# Patient Record
Sex: Male | Born: 1955 | Race: Black or African American | Hispanic: No | Marital: Single | Smoking: Never smoker
Health system: Southern US, Community
[De-identification: ages and names within clinical notes are randomized; demographics above are authoritative.]

## PROBLEM LIST (undated history)

## (undated) DIAGNOSIS — R569 Unspecified convulsions: Secondary | ICD-10-CM

## (undated) HISTORY — PX: TONSILLECTOMY: SUR1361

---

## 1998-04-20 ENCOUNTER — Emergency Department (HOSPITAL_COMMUNITY): Admission: EM | Admit: 1998-04-20 | Discharge: 1998-04-20 | Payer: Self-pay | Admitting: Emergency Medicine

## 1998-05-06 ENCOUNTER — Emergency Department (HOSPITAL_COMMUNITY): Admission: EM | Admit: 1998-05-06 | Discharge: 1998-05-06 | Payer: Self-pay | Admitting: Emergency Medicine

## 1998-07-10 ENCOUNTER — Emergency Department (HOSPITAL_COMMUNITY): Admission: EM | Admit: 1998-07-10 | Discharge: 1998-07-10 | Payer: Self-pay | Admitting: Emergency Medicine

## 1998-10-10 ENCOUNTER — Emergency Department (HOSPITAL_COMMUNITY): Admission: EM | Admit: 1998-10-10 | Discharge: 1998-10-10 | Payer: Self-pay | Admitting: Emergency Medicine

## 2000-02-16 ENCOUNTER — Emergency Department (HOSPITAL_COMMUNITY): Admission: EM | Admit: 2000-02-16 | Discharge: 2000-02-16 | Payer: Self-pay | Admitting: Emergency Medicine

## 2000-02-17 ENCOUNTER — Encounter: Payer: Self-pay | Admitting: Emergency Medicine

## 2000-02-23 ENCOUNTER — Emergency Department (HOSPITAL_COMMUNITY): Admission: EM | Admit: 2000-02-23 | Discharge: 2000-02-23 | Payer: Self-pay | Admitting: Emergency Medicine

## 2000-08-04 ENCOUNTER — Emergency Department (HOSPITAL_COMMUNITY): Admission: EM | Admit: 2000-08-04 | Discharge: 2000-08-04 | Payer: Self-pay | Admitting: Emergency Medicine

## 2001-05-09 ENCOUNTER — Encounter: Payer: Self-pay | Admitting: Emergency Medicine

## 2001-05-09 ENCOUNTER — Emergency Department (HOSPITAL_COMMUNITY): Admission: EM | Admit: 2001-05-09 | Discharge: 2001-05-09 | Payer: Self-pay | Admitting: Emergency Medicine

## 2001-05-14 ENCOUNTER — Emergency Department (HOSPITAL_COMMUNITY): Admission: EM | Admit: 2001-05-14 | Discharge: 2001-05-14 | Payer: Self-pay | Admitting: *Deleted

## 2002-06-25 ENCOUNTER — Emergency Department (HOSPITAL_COMMUNITY): Admission: EM | Admit: 2002-06-25 | Discharge: 2002-06-25 | Payer: Self-pay | Admitting: Emergency Medicine

## 2004-10-15 ENCOUNTER — Ambulatory Visit: Payer: Self-pay | Admitting: Internal Medicine

## 2006-07-28 ENCOUNTER — Emergency Department (HOSPITAL_COMMUNITY): Admission: EM | Admit: 2006-07-28 | Discharge: 2006-07-28 | Payer: Self-pay | Admitting: Emergency Medicine

## 2006-07-31 ENCOUNTER — Emergency Department (HOSPITAL_COMMUNITY): Admission: EM | Admit: 2006-07-31 | Discharge: 2006-07-31 | Payer: Self-pay | Admitting: Emergency Medicine

## 2007-07-30 ENCOUNTER — Emergency Department (HOSPITAL_COMMUNITY): Admission: EM | Admit: 2007-07-30 | Discharge: 2007-07-30 | Payer: Self-pay | Admitting: Emergency Medicine

## 2007-10-16 ENCOUNTER — Inpatient Hospital Stay (HOSPITAL_COMMUNITY): Admission: EM | Admit: 2007-10-16 | Discharge: 2007-10-22 | Payer: Self-pay | Admitting: Emergency Medicine

## 2007-11-10 ENCOUNTER — Telehealth: Payer: Self-pay | Admitting: *Deleted

## 2008-08-01 ENCOUNTER — Emergency Department (HOSPITAL_COMMUNITY): Admission: EM | Admit: 2008-08-01 | Discharge: 2008-08-01 | Payer: Self-pay | Admitting: Emergency Medicine

## 2009-03-17 ENCOUNTER — Emergency Department (HOSPITAL_COMMUNITY): Admission: EM | Admit: 2009-03-17 | Discharge: 2009-03-18 | Payer: Self-pay | Admitting: Emergency Medicine

## 2009-03-24 ENCOUNTER — Emergency Department (HOSPITAL_COMMUNITY): Admission: EM | Admit: 2009-03-24 | Discharge: 2009-03-24 | Payer: Self-pay | Admitting: Family Medicine

## 2010-09-28 ENCOUNTER — Emergency Department (HOSPITAL_COMMUNITY)
Admission: EM | Admit: 2010-09-28 | Discharge: 2010-09-28 | Payer: Self-pay | Source: Home / Self Care | Admitting: Emergency Medicine

## 2011-02-19 LAB — POCT I-STAT, CHEM 8
Chloride: 107 mEq/L (ref 96–112)
Creatinine, Ser: 1 mg/dL (ref 0.4–1.5)
Glucose, Bld: 93 mg/dL (ref 70–99)
Hemoglobin: 15.6 g/dL (ref 13.0–17.0)
Potassium: 4 mEq/L (ref 3.5–5.1)

## 2011-03-26 NOTE — Discharge Summary (Signed)
NAMESEANPATRICK, Raymond [name]                ACCOUNT NO.:  [id number]   MEDICAL RECORD NO.:  [id number]          PATIENT TYPE:  INP   LOCATION:  1435                         FACILITY:  [facility]   PHYSICIAN:  Hillery Aldo, M.D.   DATE OF BIRTH:  [birth year]   DATE OF ADMISSION:  10/16/2007  DATE OF DISCHARGE:  10/22/2007                               DISCHARGE SUMMARY   PRIMARY CARE PHYSICIAN:  None, but has seen Dr. Yisroel Ramming in the  past.  He will be referred back to Dr. Philipp Deputy for further follow up  hospital care.   DISCHARGE DIAGNOSES:  1. Alcohol withdrawal seizures.  2. Alcohol dependency.  3. Hyponatremia.  4. Alcoholic hepatitis.  5. Delirium secondary to alcohol withdrawal.  6. Thrombocytopenia secondary to alcohol abuse.  7. Hypertension  8. Hypokalemia.  9. Hypomagnesemia.  10.Foot pain, questionable gout.  11.Paranasal sinus disease.   DISCHARGE MEDICATIONS:  1. Indocin 50 mg q.8 h. p.r.n.  2. Clonidine 0.1 mg q.12 h. p.r.n.  3. Multivitamin daily.   CONSULTATIONS:  Dr. Jeanie Sewer of psychiatry.   HISTORY OF PRESENT ILLNESS:  The patient is a 55 year old male with a  past medical history of heavy alcohol abuse.  The patient blacked out  and was brought to the hospital by EMS for further evaluation.  An eye  witness reported to EMS that the patient was having a tonic-clonic  seizure and he was therefore brought to the hospital for further  evaluation and workup.   PROCEDURES/DIAGNOSTICS:  1. CT scan of the head on October 16, 2007 showed no acute      intracranial findings.  Moderate generalized atrophy.  Paranasal      sinus disease with opacification of right maxillary sinus.  2. EEG on October 19, 2007 showed no definite epileptiform features.   LABORATORY DATA:  Sodium 131, potassium 4.0, chloride 99, bicarb 25, BUN  5, creatinine 0.70, glucose 101, total bilirubin 1.1, alkaline  phosphatase 58, AST 69, ALT 33, total protein 8.3, albumin 3.2, calcium  9.7,  white blood cell count 8.8, hemoglobin 14, hematocrit 39.6,  platelets 134.   HOSPITAL COURSE:  1. Alcohol withdrawal seizure/alcohol dependency:  The patient was      admitted and put on an Ativan protocol.  His Ativan was tapered to      p.r.n. dosing only at the time of discharge.  He was put on seizure      precautions with no further seizure events noted.  He was      supplemented with thiamine and folic acid.  A psychiatry      consultation was requested and kindly provided by Dr. Jeanie Sewer.      The patient declined inpatient rehabilitation and was not felt to      be committable.  He was provided with outpatient resources for      ongoing treatment of his alcohol dependency.  At this time, he is      medically stable for discharge and plans to follow up in an alcohol      treatment outpatient program.  2. Electrolyte abnormalities:  The patient's hypomagnesemia and      hypokalemia was corrected.  He still has some mild hyponatremia      which should resolve with avoidance of alcohol.  3. Alcoholic hepatitis:  Patient's transaminase elevation has steadily      improved through the course of his hospitalization.  It is expected      that he will have normalization of his liver function studies with      avoidance of alcohol.  4. Thrombocytopenia:  The patient's thrombocytopenia is felt to be due      to toxic effects of alcohol and bone marrow.  It was mild and the      patient did not experience any signs of active bleeding.  5. Hypertension:  The patient was started on clonidine for control of      his blood pressure.  He will be discharged on this medication and      instructions to follow up with Dr. Philipp Deputy for ongoing surveillance      of his blood pressure issues problem.  6. Left foot pain:  The patient did develop some left foot pain in the      instep area.  There is no obvious swelling or erythema to the area.      It was felt that this may be due to gout versus  tendonitis.  The      patient was put on Indocin to use as needed for pain control.   DISPOSITION:  The patient is medically stable for discharge.  He  declines outpatient physical therapy despite his fairly unsteady gait.  He will be provided with a rolling walker and will be discharged home.  He has a girlfriend who will help provide his care and who he will live  with upon discharge.  The patient is provided with Dr. Danella Deis number  and instructed to call her for a follow up appointment.      Hillery Aldo, M.D.  Electronically Signed     CR/MEDQ  D:  10/22/2007  T:  10/22/2007  Job:  [id number]   cc:   Tresa Endo L. Philipp Deputy, M.D.  Fax: [phone]

## 2011-03-26 NOTE — Procedures (Signed)
EEG NUMBER:  [id number].   REFERRING PHYSICIAN:  [facility].   EEG is done on October 19, 2007.   CLINICAL HISTORY:  This is a 55 year old male being evaluated for  alcohol withdrawal seizures.   MEDICATIONS LISTED:  1. Protonix.  2. Multivitamin.  3. Folic acid.  4. Lovenox.  5. Ativan.  6. Phenergan.  7. Reglan.  8. Imodium.   This is a portable EEG recorded with the patient described as being  awake and confused using 17-channel machine and standard 10/20 electrode  placement.   Background awake rhythm consists of 8 Hz alpha which is admixed with  high frequency low amplitude beta activity seen throughout the tracing.  No definite epileptiform features were noted.  No paroxysmal  epileptiform activity, spikes or sharp waves are seen.  Changes of  drowsiness are achieved and show normal physiological findings.  No  definite sleep stages are seen.  Hyperventilation is not performed.  Photic stimulation was also not done, this being a portable study.  Technical component of the study is average with excessive muscle  artifact.  Length of this tracing is 22.9 minutes.  EKG tracing reveals  regular sinus rhythm.   IMPRESSION:  This EEG performed during awake and drowsy states is within  normal limits.  No definite epileptiform features were noted.           ______________________________  Sunny Schlein. Pearlean Brownie, MD    EAV:[id number]  D:  10/19/2007 18:30:45  T:  10/20/2007 09:22:19  Job #:  [id number]   cc:   [facility]

## 2011-03-26 NOTE — H&P (Signed)
[name], [name]                ACCOUNT NO.:  [id number]   MEDICAL RECORD NO.:  [id number]          PATIENT TYPE:  INP   LOCATION:  0106                         FACILITY:  [facility]   PHYSICIAN:  Mobolaji B. Bakare, M.D.DATE OF BIRTH:  07-30-1956   DATE OF ADMISSION:  10/16/2007  DATE OF DISCHARGE:                              HISTORY & PHYSICAL   PRIMARY CARE PHYSICIAN:  Unassigned.   CHIEF COMPLAINT:  Seizures.   HISTORY OF PRESENTING COMPLAINT:  Raymond [name] is a 55 year old African-  American male with a history of alcohol abuse.  He was brought to the  emergency room by the EMS with a complaint of seizures.  There is no  eyewitness accounts available.  I do not have a record of EMS.  The  patient stated that the last thing he remembers prior to arrival to the  emergency room was walking on the sidewalk between 2:30 and 3:00 p.m.  today on his way to the grocery store.  He apparently has been feeling  shaky earlier this morning.  He normally drinks about 40 ounces of beer  every day.  He gets alcohol withdrawal tremors whenever he does not.  He  did not have anything to drink today.  He was on his way to the grocery  store.  He was lying outside [employer].  An eyewitness reported to the EMS  that patient was shaking all over the ground.  Upon arrival by EMS, his  CBG was 143.  He had a 40 ounce beer next to him.  Patient was  subsequently escorted safely to the emergency room.   On arrival here, the patient was awake but having tremors.  He was  oriented x3.  His vitals were within normal except for sinus  tachycardia.   Patient has had a head CT scan, which was negative for any intracranial  abnormality.  [facility] was called for further evaluation and treatment.   The patient states that he is inclined to quit drinking.  He will be  admitted and started on acute detoxification with Ativan withdrawal  protocol.   REVIEW OF SYSTEMS:  He denies cough, fever, chest pain, headaches.   No  change in his vision.  He feels weak on his right lower extremity.   PAST MEDICAL HISTORY:  1. Patient had similar alcohol-related seizures about 15 years ago.  2. He had a comminuted fracture of his right femoris in September,      2007.   MEDICATIONS:  None.   ALLERGIES:  No known drug allergies.   FAMILY HISTORY:  Both parents are deceased.  Mother had Alzheimer's.  Father passed away from old age.   SOCIAL HISTORY:  Patient is currently unemployed because of his right  femoral fracture.  He denies illicit drug abuse.  He denies smoking.  He  drinks 40 ounces of beer almost every day, and he has been doing this  for several years.   PHYSICAL EXAMINATION:  INITIAL VITALS:  Temperature 98, blood pressure  128/84, pulse 116, respiratory rate 18 seconds per minute.  O2 sats of  94%.  On examination, patient is awake, alert and oriented to time, place, and  person.  Normocephalic and atraumatic head.  Pupils are equal, round and reactive  to light.  Extraocular muscle movement intact.  No elevated JVD.  Mucous  membranes moist.  LUNGS:  Clear clinically to auscultation.  CVS:  S1 and S2 regular.  No murmur, no gallop.  ABDOMEN:  Nondistended, soft, nontender.  Bowel sounds present.  EXTREMITIES:  No pedal edema or calf tenderness.  Patient has tremors.  SKIN:  He has what appears like a heat rash on his posterior trunk.  No  acute erythematous rash noted.  CNS:  Multiple 5/5 in all limbs.  Cranial nerves are intact.  There is  no objective weakness in the right lower extremity noted.  Patient was  not ambulated because of unsteadiness.   INITIAL LABORATORY DATA:  White cell count 10.5, hemoglobin 14.6,  hematocrit 41.6, platelets 98, neutrophils 87%, lymphocytes 8%, absolute  neutrophil count 9.2.  Sodium 139, potassium 3.9, chloride 98, CO2 22,  glucose 177.  BUN 2, creatinine 0.92.  Total bilirubin 0.5, AST 144, ALT  47, total protein 9.1, albumin 3.8, calcium 9.6, total  bilirubin 1.5,  alkaline phosphatase 65.  Alcohol level less than 5.   Head CT scan showed no acute intracranial abnormality.   ADMISSION DIAGNOSES:  1. Alcohol-withdrawal seizures.  2. Alcohol dependence.  3. Thrombocytopenia related to alcohol.   PLAN:  Admit to telemetry.  Start on Ativan withdrawal protocol.  Thiamine 100 mg daily.  Multivitamin 1 daily.  Folic acid once daily.  IV fluids with normal saline 125 cc/hr with initial banana bag.  PT/OT  evaluation.  He will be placed on fall and seizure precautions.  Patient  will need referral to [facility] for alcohol rehabilitation.      Mobolaji B. Corky Downs, M.D.  Electronically Signed     MBB/MEDQ  D:  10/16/2007  T:  10/16/2007  Job:  [id number]

## 2011-03-26 NOTE — Consult Note (Signed)
NAMEAUGIE, VANE NO.:  [id number]   MEDICAL RECORD NO.:  [id number]          PATIENT TYPE:  INP   LOCATION:  1435                         FACILITY:  [facility]   PHYSICIAN:  Antonietta Breach, M.D.  DATE OF BIRTH:  1956/02/05   DATE OF CONSULTATION:  10/19/2007  DATE OF DISCHARGE:  10/22/2007                                 CONSULTATION   REASON FOR CONSULTATION:  Alcohol dependence.   REQUESTING PHYSICIAN:  [facility].   HISTORY OF PRESENT ILLNESS:  Mr. Raymond [name] is a 55 year old male  admitted to [facility] on October 16, 2007, with a seizure.   The patient drinks excessive amounts of alcohol daily to the point of  tolerance and to the point of having alcohol withdrawal.  He now had a  withdrawal seizure.  He has been placed on the alcohol withdrawal  protocol.   The patient's mood is within normal limits.  He does have thoughts of  harming himself or others.  He has no hallucinations, delusions.  He is  cooperative with medical care in the hospital and socially appropriate.   PAST PSYCHIATRIC HISTORY:  Mr. Raymond [name] initially had confusion and  impairment in reasoning and thought process when he was first admitted  to the hospital.  This has now cleared and he is able to make healthcare  decision.   FAMILY PSYCHIATRIC HISTORY:  None.   SOCIAL HISTORY:  Mr. Raymond [name] is single.  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   PAST MEDICAL HISTORY:  Status post seizure.   MENTAL STATUS EXAM:  Mr. Raymond [name] is alert.  He is oriented to all spheres.  His eye contact is good.  He is socially appropriate.  Affect is broad  and appropriate.  Mood within normal limits.  Thought process logical,  coherent, goal-directed.  No looseness of associations.  Memory function  intact.  Thought content no thoughts of harming himself.  No thoughts of  harming others.  No delusions, no hallucinations.  Judgment is intact.  He is socially appropriate.   ASSESSMENT:  AXIS I:   Alcohol dependence.  AXIS II:  Deferred.  AXIS III:  Status post seizure.  AXIS IV:  Primary support group.  AXIS V:  55.   Mr. Raymond [name] has now regained his ability to make __________ consistent  choice.  He can differentiate between his options and their associated  risks versus benefits.  He can appreciate the risk of his alcohol  lifestyle regarding his health, and he can reason well.   Mr. Raymond [name] does have the capacity for informed consent including choosing  whether or not to participate in alcohol rehabilitation.   The undersigned recommended that the patient enter an alcohol  rehabilitation and prevention program.  However, the patient declined  and __________ .   RECOMMENDATIONS:  If the patient changes his mind, would call [phone]  for his rehabilitation options, and would provide information on 12-step  groups.      Antonietta Breach, M.D.  Electronically Signed     JW/MEDQ  D:  11/01/2007  T:  11/02/2007  Job:  [id number]

## 2011-08-19 LAB — CBC
HCT: 39.6
HCT: 40.3
Hemoglobin: 14
Hemoglobin: 14.6
MCHC: 35.1
MCHC: 35.5
MCV: 94.3
MCV: 95.4
MCV: 95.7
Platelets: 93 — ABNORMAL LOW
RBC: 4.17 — ABNORMAL LOW
RBC: 4.23
RBC: 4.41
RDW: 12.7
RDW: 12.8
WBC: 10.2

## 2011-08-19 LAB — URINALYSIS, MICROSCOPIC ONLY
Ketones, ur: 15 — AB
Protein, ur: 100 — AB
Urobilinogen, UA: 2 — ABNORMAL HIGH

## 2011-08-19 LAB — MAGNESIUM: Magnesium: 1.9

## 2011-08-19 LAB — DIFFERENTIAL
Eosinophils Relative: 0
Monocytes Absolute: 0.5
Monocytes Relative: 5
Neutrophils Relative %: 87 — ABNORMAL HIGH

## 2011-08-19 LAB — COMPREHENSIVE METABOLIC PANEL
ALT: 49
AST: 108 — ABNORMAL HIGH
AST: 152 — ABNORMAL HIGH
Albumin: 3.3 — ABNORMAL LOW
Alkaline Phosphatase: 56
Alkaline Phosphatase: 58
BUN: 3 — ABNORMAL LOW
BUN: 5 — ABNORMAL LOW
CO2: 18 — ABNORMAL LOW
CO2: 22
Calcium: 9.2
Calcium: 9.6
Chloride: 107
Chloride: 99
Creatinine, Ser: 0.67
Creatinine, Ser: 0.69
Creatinine, Ser: 0.7
Creatinine, Ser: 0.92
GFR calc Af Amer: >60
GFR calc Af Amer: >60
GFR calc non Af Amer: >60
GFR calc non Af Amer: >60
Glucose, Bld: 101 — ABNORMAL HIGH
Glucose, Bld: 173 — ABNORMAL HIGH
Potassium: 4
Potassium: 4.5
Sodium: 133 — ABNORMAL LOW
Total Bilirubin: 1.1
Total Bilirubin: 1.9 — ABNORMAL HIGH
Total Protein: 8.3
Total Protein: 8.4 — ABNORMAL HIGH

## 2011-08-19 LAB — BASIC METABOLIC PANEL
BUN: 2 — ABNORMAL LOW
Calcium: 8.9
Creatinine, Ser: 0.67
GFR calc non Af Amer: >60
Glucose, Bld: 89
Sodium: 134 — ABNORMAL LOW

## 2011-08-19 LAB — HEMOGLOBIN A1C: Hgb A1c MFr Bld: 6

## 2011-08-19 LAB — RAPID URINE DRUG SCREEN, HOSP PERFORMED
Amphetamines: NOT DETECTED
Barbiturates: NOT DETECTED
Benzodiazepines: NOT DETECTED
Cocaine: NOT DETECTED

## 2011-08-19 LAB — PROTIME-INR: INR: 1.1

## 2013-04-14 ENCOUNTER — Inpatient Hospital Stay (HOSPITAL_COMMUNITY)
Admission: EM | Admit: 2013-04-14 | Discharge: 2013-04-18 | DRG: 897 | Disposition: A | Discharge status: Home / Self Care | Payer: MEDICAID | Attending: Internal Medicine | Admitting: Internal Medicine

## 2013-04-14 ENCOUNTER — Encounter (HOSPITAL_COMMUNITY): Payer: Self-pay

## 2013-04-14 ENCOUNTER — Emergency Department (HOSPITAL_COMMUNITY): Payer: Self-pay

## 2013-04-14 DIAGNOSIS — R569 Unspecified convulsions: Secondary | ICD-10-CM

## 2013-04-14 DIAGNOSIS — M6282 Rhabdomyolysis: Secondary | ICD-10-CM

## 2013-04-14 DIAGNOSIS — F10231 Alcohol dependence with withdrawal delirium: Principal | ICD-10-CM | POA: Diagnosis present

## 2013-04-14 DIAGNOSIS — F1020 Alcohol dependence, uncomplicated: Secondary | ICD-10-CM | POA: Diagnosis present

## 2013-04-14 DIAGNOSIS — D696 Thrombocytopenia, unspecified: Secondary | ICD-10-CM | POA: Diagnosis present

## 2013-04-14 DIAGNOSIS — R7989 Other specified abnormal findings of blood chemistry: Secondary | ICD-10-CM | POA: Diagnosis present

## 2013-04-14 DIAGNOSIS — F10931 Alcohol use, unspecified with withdrawal delirium: Principal | ICD-10-CM | POA: Diagnosis present

## 2013-04-14 DIAGNOSIS — E860 Dehydration: Secondary | ICD-10-CM | POA: Diagnosis present

## 2013-04-14 DIAGNOSIS — E876 Hypokalemia: Secondary | ICD-10-CM

## 2013-04-14 DIAGNOSIS — E872 Acidosis, unspecified: Secondary | ICD-10-CM

## 2013-04-14 DIAGNOSIS — F1010 Alcohol abuse, uncomplicated: Secondary | ICD-10-CM

## 2013-04-14 HISTORY — DX: Unspecified convulsions: R56.9

## 2013-04-14 LAB — RAPID URINE DRUG SCREEN, HOSP PERFORMED
Barbiturates: NOT DETECTED
Cocaine: NOT DETECTED
Tetrahydrocannabinol: NOT DETECTED

## 2013-04-14 LAB — COMPREHENSIVE METABOLIC PANEL
ALT: 49 U/L (ref 0–53)
Alkaline Phosphatase: 50 U/L (ref 39–117)
CO2: 13 mEq/L — ABNORMAL LOW (ref 19–32)
Chloride: 91 mEq/L — ABNORMAL LOW (ref 96–112)
GFR calc Af Amer: >90 mL/min (ref >90)
GFR calc non Af Amer: >90 mL/min (ref >90)
Glucose, Bld: 103 mg/dL — ABNORMAL HIGH (ref 70–99)
Potassium: 3.9 mEq/L (ref 3.5–5.1)
Sodium: 133 mEq/L — ABNORMAL LOW (ref 135–145)
Total Protein: 9.1 g/dL — ABNORMAL HIGH (ref 6.0–8.3)

## 2013-04-14 LAB — CBC WITH DIFFERENTIAL/PLATELET
Lymphocytes Relative: 8 % — ABNORMAL LOW (ref 12–46)
Lymphs Abs: 0.7 10*3/uL (ref 0.7–4.0)
Neutrophils Relative %: 83 % — ABNORMAL HIGH (ref 43–77)
Platelets: 119 10*3/uL — ABNORMAL LOW (ref 150–400)
RBC: 4.17 MIL/uL — ABNORMAL LOW (ref 4.22–5.81)
WBC: 9.6 10*3/uL (ref 4.0–10.5)

## 2013-04-14 LAB — URINALYSIS, ROUTINE W REFLEX MICROSCOPIC
Bilirubin Urine: NEGATIVE
Ketones, ur: 15 mg/dL — AB
Nitrite: NEGATIVE
pH: 5.5 (ref 5.0–8.0)

## 2013-04-14 LAB — URINE MICROSCOPIC-ADD ON

## 2013-04-14 MED ORDER — SODIUM CHLORIDE 0.9 % IV BOLUS (SEPSIS)
1000.0000 mL | Freq: Once | INTRAVENOUS | Status: AC
  Administered 2013-04-14: 1000 mL via INTRAVENOUS

## 2013-04-14 MED ORDER — THIAMINE HCL 100 MG/ML IJ SOLN
100.0000 mg | Freq: Every day | INTRAMUSCULAR | Status: DC
  Filled 2013-04-14 (×2): qty 1

## 2013-04-14 MED ORDER — LORAZEPAM 1 MG PO TABS
1.0000 mg | ORAL_TABLET | Freq: Four times a day (QID) | ORAL | Status: DC | PRN
  Administered 2013-04-15 – 2013-04-16 (×3): 1 mg via ORAL
  Filled 2013-04-14 (×2): qty 1

## 2013-04-14 MED ORDER — FOLIC ACID 1 MG PO TABS
1.0000 mg | ORAL_TABLET | Freq: Every day | ORAL | Status: DC
  Administered 2013-04-15 – 2013-04-18 (×4): 1 mg via ORAL
  Filled 2013-04-14 (×4): qty 1

## 2013-04-14 MED ORDER — SODIUM CHLORIDE 0.9 % IV SOLN: INTRAVENOUS | Status: DC

## 2013-04-14 MED ORDER — ADULT MULTIVITAMIN W/MINERALS CH
1.0000 | ORAL_TABLET | Freq: Every day | ORAL | Status: DC
  Administered 2013-04-15 – 2013-04-18 (×4): 1 via ORAL
  Filled 2013-04-14 (×4): qty 1

## 2013-04-14 MED ORDER — LORAZEPAM 2 MG/ML IJ SOLN
1.0000 mg | Freq: Four times a day (QID) | INTRAMUSCULAR | Status: DC | PRN
  Administered 2013-04-15: 1 mg via INTRAVENOUS

## 2013-04-14 MED ORDER — LORAZEPAM 1 MG PO TABS: 0.0000 mg | ORAL_TABLET | Freq: Two times a day (BID) | ORAL | Status: DC

## 2013-04-14 MED ORDER — VITAMIN B-1 100 MG PO TABS
100.0000 mg | ORAL_TABLET | Freq: Every day | ORAL | Status: DC
  Administered 2013-04-15 – 2013-04-18 (×4): 100 mg via ORAL
  Filled 2013-04-14 (×4): qty 1

## 2013-04-14 MED ORDER — LORAZEPAM 1 MG PO TABS
0.0000 mg | ORAL_TABLET | Freq: Four times a day (QID) | ORAL | Status: AC
  Administered 2013-04-15 (×2): 2 mg via ORAL
  Administered 2013-04-16: 1 mg via ORAL
  Filled 2013-04-14: qty 2
  Filled 2013-04-14: qty 1
  Filled 2013-04-14 (×2): qty 2

## 2013-04-14 NOTE — ED Notes (Signed)
PT reports that he was feeling weak/lightheaded prior to episode. Pt reports only eating crackers today. States he fell to his knees during "seizure" but denies LOC. Pt AO x 4 at this time, but remains weak.

## 2013-04-14 NOTE — ED Provider Notes (Signed)
History     CSN: [id number]  Arrival date & time 04/14/13  1813   First MD Initiated Contact with Patient 04/14/13 1822      Chief Complaint  Patient presents with  . Seizures    (Consider location/radiation/quality/duration/timing/severity/associated sxs/prior treatment) HPI Comments: Patient was at work today in the hot sun.  After the day was over, he sat on a bench and experienced generalized shaking but did not lose consciousness.  This lasted for several minutes then resolved.  He denies any biting of the lip or tongue and there was no bowel or bladder incontinence.  He reports a similar episode six years ago.    Patient is a 57 y.o. male presenting with seizures. The history is provided by the patient.  Seizures Seizure activity on arrival: no   Seizure type:  Unable to specify Preceding symptoms: no sensation of an aura present   Initial focality:  None Episode characteristics: generalized shaking and partial responsiveness   Episode characteristics: no abnormal movements and no incontinence   Return to baseline: yes   Severity:  Moderate Timing:  Once Progression:  Resolved   Past Medical History  Diagnosis Date  . Seizures     Past Surgical History  Procedure Laterality Date  . Tonsillectomy      History reviewed. No pertinent family history.  History  Substance Use Topics  . Smoking status: Never Smoker   . Smokeless tobacco: Not on file  . Alcohol Use: Yes     Comment: 2 beers/day      Review of Systems  Neurological: Positive for seizures.  All other systems reviewed and are negative.    Allergies  Review of patient's allergies indicates no known allergies.  Home Medications  No current outpatient prescriptions on file.  BP 144/88  Pulse 109  Temp(Src) 97.8 F (36.6 C) (Oral)  Resp 18  SpO2 97%  Physical Exam  Nursing note and vitals reviewed. Constitutional: He is oriented to person, place, and time. He appears well-developed and  well-nourished. No distress.  HENT:  Head: Normocephalic and atraumatic.  Mouth/Throat: Oropharynx is clear and moist.  Eyes: EOM are normal. Pupils are equal, round, and reactive to light.  Neck: Normal range of motion. Neck supple.  Cardiovascular: Normal rate and regular rhythm.   No murmur heard. Pulmonary/Chest: Effort normal and breath sounds normal. No respiratory distress. He has no wheezes.  Abdominal: Soft. Bowel sounds are normal. He exhibits no distension. There is no tenderness.  Musculoskeletal: Normal range of motion. He exhibits no edema.  Neurological: He is alert and oriented to person, place, and time. No cranial nerve deficit. He exhibits normal muscle tone. Coordination normal.  He is somewhat slow to respond, but appropriate.  Skin: Skin is warm and dry. He is not diaphoretic.    ED Course  Procedures (including critical care time)  Labs Reviewed  CBC WITH DIFFERENTIAL  COMPREHENSIVE METABOLIC PANEL   No results found.   No diagnosis found.   Date: 04/14/2013  Rate: 106  Rhythm: sinus tachycardia  QRS Axis: normal  Intervals: normal  ST/T Wave abnormalities: normal  Conduction Disutrbances:none  Narrative Interpretation:   Old EKG Reviewed: none available     MDM  The patient presents after an episode of tonic-clonic movements that occurred after working in the hot sun all day.  The workup reveals a CO2 of 13 and a metabolic acidosis.  He was given more fluids as the ck also returned elevated.  He was  ambulated to the bathroom, but never returned.  He was found there on the floor bleeding from the mouth and confused.  I suspect he has had another seizure.  I have spoken with medicine who will admit, want neurology consulted.    CRITICAL CARE Performed by: Geoffery Lyons Total critical care time: 30 minutes Critical care time was exclusive of separately billable procedures and treating other patients. Critical care was necessary to treat or prevent  imminent or life-threatening deterioration. Critical care was time spent personally by me on the following activities: development of treatment plan with patient and/or surrogate as well as nursing, discussions with consultants, evaluation of patient's response to treatment, examination of patient, obtaining history from patient or surrogate, ordering and performing treatments and interventions, ordering and review of laboratory studies, ordering and review of radiographic studies, pulse oximetry and re-evaluation of patient's condition.         Geoffery Lyons, MD 04/14/13 2257

## 2013-04-14 NOTE — H&P (Signed)
[facility] History and Physical  Heinrich Fertig ZOX:[id number] DOB: 1955-12-22 DOA: 04/14/2013  Referring physician: ER physician. PCP: No PCP Per Patient  Specialists: None.  Chief Complaint: Possible seizures.  HPI: Raymond [name] is a 57 y.o. male while at work was found to have sudden loss of consciousness with jerking movements of both upper and lower extremities as witnessed by patient's colleagues. From the report was received patient had these episodes lasting for less than a minute after which patient became confused. Patient on my exam states that he does not recall the incident. In the ER patient had gone to the bathroom and few minutes later he was found confused lying on the floor. Presently patient is alert awake and oriented to time place and person. His CT head was negative for anything acute. Patient denies any headache focal deficits visual symptoms. Patient will be admitted for possible seizures. Reviewing patient's chart patient did have previous seizure probably from alcohol withdrawal in 2008. Patient states he drinks alcohol only once or twice a week. Last drink was yesterday.  Review of Systems: As presented in the history of presenting illness, rest negative.  Past Medical History  Diagnosis Date  . Seizures    Past Surgical History  Procedure Laterality Date  . Tonsillectomy     Social History:  reports that he has never smoked. He does not have any smokeless tobacco history on file. He reports that  drinks alcohol. He reports that he does not use illicit drugs. At home. where does patient live-- Can do ADLs. Can patient participate in ADLs?  No Known Allergies  Family History  Problem Relation Age of Onset  . Other Neg Hx       Prior to Admission medications   Not on File   Physical Exam: Filed Vitals:   04/14/13 1825 04/14/13 2000 04/14/13 2100 04/14/13 2130  BP: 144/88 151/97 150/85 152/82  Pulse: 109 104 89 94  Temp: 97.8 F (36.6 C)      TempSrc: Oral     Resp: 18 18 17 17   SpO2: 97% 99% 98% 97%     General:  Well-developed and nourished.  Eyes: Perla positive.  ENT: No discharge from ears eyes nose mouth.  Neck: No mass felt.  Cardiovascular: S1-S2 heard.  Respiratory: No rhonchi no crepitations.  Abdomen: Soft nontender bowel sounds present.  Skin: No rash.  Musculoskeletal: No edema.  Psychiatric: Appears normal.  Neurologic: Alert awake oriented to time place and person. Moves all extremities.  Labs on Admission:  Basic Metabolic Panel:  Recent Labs Lab 04/14/13 1910  NA 133*  K 3.9  CL 91*  CO2 13*  GLUCOSE 103*  BUN 9  CREATININE 0.90  CALCIUM 9.5   Liver Function Tests:  Recent Labs Lab 04/14/13 1910  AST 135*  ALT 49  ALKPHOS 50  BILITOT 0.6  PROT 9.1*  ALBUMIN 4.6   No results found for this basename: LIPASE, AMYLASE,  in the last 168 hours No results found for this basename: AMMONIA,  in the last 168 hours CBC:  Recent Labs Lab 04/14/13 1910  WBC 9.6  NEUTROABS 8.0*  HGB 13.6  HCT 38.5*  MCV 92.3  PLT 119*   Cardiac Enzymes:  Recent Labs Lab 04/14/13 2055  CKTOTAL 2196*    BNP (last 3 results) No results found for this basename: PROBNP,  in the last 8760 hours CBG: No results found for this basename: GLUCAP,  in the last 168 hours  Radiological Exams  on Admission: Ct Head Wo Contrast  04/14/2013   *RADIOLOGY REPORT*  Clinical Data: Seizure activity.  Witnessed whole body seizure.  CT HEAD WITHOUT CONTRAST  Technique:  Contiguous axial images were obtained from the base of the skull through the vertex without contrast.  Comparison: None.  Findings: Mild motion artifact is present on the examination.  No gross mass lesion, mass effect, midline shift, hydrocephalus, or hemorrhage.  The calvarium appears intact without grossly displaced skull fractures.  No acute infarct is identified.  Motion artifact is because of the patient with shaking during examination.   There is atrophy which appears slightly age advanced. Old right medial orbital wall blowout fracture.  Mild maxillary sinus disease.  IMPRESSION:  Nonspecific atrophy without gross acute intracranial abnormality.   Original Report Authenticated By: Andreas Newport, M.D.    EKG: Independently reviewed. Sinus tachycardia.  Assessment/Plan Principal Problem:   Convulsions Active Problems:   Rhabdomyolysis   Metabolic acidosis   1. Seizures - at this time patient has been placed on seizure precautions and when necessary Ativan. I have ordered MRI brain and EEG. Further recommendations per neurologist. 2. Metabolic acidosis - probably from lactic acidosis. Check lactate levels we'll also check salicylate levels. Aggressively hydrate and recheck metabolic panel. 3. Rhabdomyolysis - probably from seizures. Hydrate and recheck CK levels. 4. Mildly elevated LFTs - probably from alcoholism. Closely follow LFTs. Further workup as outpatient if there is no significant increase in LFTs. Tylenol levels is pending. 5. Mild thrombocytopenia - suspect this could be due to alcohol. Follow CBC. 6. History of alcohol abuse - patient states that he drinks very infrequently notice. At this time I have placed patient on when necessary Ativan for possible alcohol withdrawal symptoms. Thiamine.    Code Status: Full code.  Family Communication: None.  Disposition Plan: Admit to inpatient.    Tynesha Free N. [facility] Pager [phone].  If 7PM-7AM, please contact night-coverage [email or web address] Password [facility] 04/14/2013, 11:25 PM

## 2013-04-14 NOTE — ED Notes (Addendum)
Per EMS: PT outside working when he had witnessed full body seizure lasting about 1 minute. Pt AO x4 on arrival, denies injury. Hx of 1 seizure in the past, which he was told was due to being overheated. Pt reports feeling really weak prior to seizure. CBG: 137

## 2013-04-14 NOTE — ED Notes (Signed)
Admitting MD at bedside.

## 2013-04-15 ENCOUNTER — Inpatient Hospital Stay (HOSPITAL_COMMUNITY): Payer: MEDICAID

## 2013-04-15 ENCOUNTER — Ambulatory Visit (HOSPITAL_COMMUNITY): Payer: Self-pay

## 2013-04-15 DIAGNOSIS — E876 Hypokalemia: Secondary | ICD-10-CM

## 2013-04-15 LAB — LACTIC ACID, PLASMA: Lactic Acid, Venous: 6.7 mmol/L — ABNORMAL HIGH (ref 0.5–2.2)

## 2013-04-15 LAB — COMPREHENSIVE METABOLIC PANEL
ALT: 49 U/L (ref 0–53)
AST: 170 U/L — ABNORMAL HIGH (ref 0–37)
Alkaline Phosphatase: 45 U/L (ref 39–117)
Calcium: 9 mg/dL (ref 8.4–10.5)
GFR calc Af Amer: >90 mL/min (ref >90)
Glucose, Bld: 89 mg/dL (ref 70–99)
Potassium: 3.2 mEq/L — ABNORMAL LOW (ref 3.5–5.1)
Sodium: 134 mEq/L — ABNORMAL LOW (ref 135–145)
Total Protein: 8 g/dL (ref 6.0–8.3)

## 2013-04-15 LAB — CBC WITH DIFFERENTIAL/PLATELET
Basophils Absolute: 0 10*3/uL (ref 0.0–0.1)
Basophils Relative: 0 % (ref 0–1)
Eosinophils Absolute: 0 10*3/uL (ref 0.0–0.7)
Eosinophils Relative: 0 % (ref 0–5)
HCT: 35.6 % — ABNORMAL LOW (ref 39.0–52.0)
Hemoglobin: 12.4 g/dL — ABNORMAL LOW (ref 13.0–17.0)
MCH: 31.5 pg (ref 26.0–34.0)
MCHC: 34.8 g/dL (ref 30.0–36.0)
MCV: 90.4 fL (ref 78.0–100.0)
Monocytes Absolute: 0.7 10*3/uL (ref 0.1–1.0)
Monocytes Relative: 9 % (ref 3–12)
RDW: 14.4 % (ref 11.5–15.5)

## 2013-04-15 LAB — TROPONIN I: Troponin I: <0.3 ng/mL (ref <0.30)

## 2013-04-15 LAB — SALICYLATE LEVEL: Salicylate Lvl: <2 mg/dL — ABNORMAL LOW (ref 2.8–20.0)

## 2013-04-15 LAB — GLUCOSE, CAPILLARY: Glucose-Capillary: 91 mg/dL (ref 70–99)

## 2013-04-15 LAB — CLOSTRIDIUM DIFFICILE BY PCR: Toxigenic C. Difficile by PCR: NEGATIVE

## 2013-04-15 LAB — ETHANOL: Alcohol, Ethyl (B): <11 mg/dL (ref 0–11)

## 2013-04-15 MED ORDER — THIAMINE HCL 100 MG/ML IJ SOLN
100.0000 mg | Freq: Every day | INTRAMUSCULAR | Status: DC
  Filled 2013-04-15: qty 1

## 2013-04-15 MED ORDER — ONDANSETRON HCL 4 MG PO TABS: 4.0000 mg | ORAL_TABLET | Freq: Four times a day (QID) | ORAL | Status: DC | PRN

## 2013-04-15 MED ORDER — SODIUM CHLORIDE 0.9 % IV SOLN
INTRAVENOUS | Status: DC
  Administered 2013-04-15: 1000 mL via INTRAVENOUS
  Administered 2013-04-15 (×2): via INTRAVENOUS

## 2013-04-15 MED ORDER — SODIUM CHLORIDE 0.9 % IJ SOLN
3.0000 mL | Freq: Two times a day (BID) | INTRAMUSCULAR | Status: DC
  Administered 2013-04-15 – 2013-04-16 (×3): 3 mL via INTRAVENOUS
  Administered 2013-04-17: 17:00:00 via INTRAVENOUS
  Administered 2013-04-17: 3 mL via INTRAVENOUS

## 2013-04-15 MED ORDER — ACETAMINOPHEN 325 MG PO TABS
650.0000 mg | ORAL_TABLET | Freq: Four times a day (QID) | ORAL | Status: DC | PRN
  Administered 2013-04-15 – 2013-04-17 (×3): 650 mg via ORAL
  Filled 2013-04-15 (×3): qty 2

## 2013-04-15 MED ORDER — GADOBENATE DIMEGLUMINE 529 MG/ML IV SOLN
15.0000 mL | Freq: Once | INTRAVENOUS | Status: AC
  Administered 2013-04-15: 15 mL via INTRAVENOUS

## 2013-04-15 MED ORDER — ACETAMINOPHEN 650 MG RE SUPP: 650.0000 mg | Freq: Four times a day (QID) | RECTAL | Status: DC | PRN

## 2013-04-15 MED ORDER — LORAZEPAM 2 MG/ML IJ SOLN
1.0000 mg | INTRAMUSCULAR | Status: DC | PRN
  Administered 2013-04-15 (×2): 1 mg via INTRAVENOUS
  Filled 2013-04-15 (×4): qty 1

## 2013-04-15 MED ORDER — ONDANSETRON HCL 4 MG/2ML IJ SOLN
4.0000 mg | Freq: Four times a day (QID) | INTRAMUSCULAR | Status: DC | PRN
  Administered 2013-04-15: 4 mg via INTRAVENOUS
  Filled 2013-04-15: qty 2

## 2013-04-15 MED ORDER — POTASSIUM CHLORIDE CRYS ER 20 MEQ PO TBCR
40.0000 meq | EXTENDED_RELEASE_TABLET | Freq: Three times a day (TID) | ORAL | Status: AC
  Administered 2013-04-15 (×3): 40 meq via ORAL
  Filled 2013-04-15 (×3): qty 2

## 2013-04-15 MED ORDER — K PHOS MONO-SOD PHOS DI & MONO 155-852-130 MG PO TABS
500.0000 mg | ORAL_TABLET | Freq: Three times a day (TID) | ORAL | Status: DC
  Administered 2013-04-15 – 2013-04-18 (×10): 500 mg via ORAL
  Filled 2013-04-15 (×12): qty 2

## 2013-04-15 NOTE — Progress Notes (Signed)
While taking phone reporton another pt, RN was notified that the pt fell while in the bathroom, sitter in the bathroom with the pt during occurrence.  Pt got up without assistance and returned to bed.   Pt had no trauma, all VS WNL, full physical assessment completed.  Pt back to bed, NP notified, charge nurse notified.  Fall prevention safety plan reviewed with pt, with no evidence of learning/understanding.  Will continue to monitor.

## 2013-04-15 NOTE — Consult Note (Signed)
Reason for Consult:Seizure Referring Physician: Toniann Fail  CC: Seizure  HPI: Raymond [name] is an 57 y.o. male who reports that today he was moving heavy logs outside.  Had been called in the office by his supervisor because it was so hot for him to take a break.  When he attempted to get back up he reports "things just went crazy" .  He reports going to the floor and hearing in the background people saying to not try to move him.  EMS was called and the patient was brought in for evaluation.  While in the Ed went to the bathroom.  Stayed for some time and did not call for help.  When staff arrived at the bathroom the patient was on the floor and had blood coming from his mouth.  Patient now is admitted.   Reports having had a seizure in 2008 while drinking.  Records reviewed from that hospitalization and patient felt to have had alcohol withdrawal seizures at that time.    Past Medical History  Diagnosis Date  . Seizures     Past Surgical History  Procedure Laterality Date  . Tonsillectomy      Family History  Problem Relation Age of Onset  . Other Neg Hx     Social History:  reports that he has never smoked. He does not have any smokeless tobacco history on file. He reports that  drinks alcohol. He reports that he does not use illicit drugs. Patient reports drinking a 40 a week with his last 40 being last week.  Had no alcohol over the weekend.    No Known Allergies  Medications:  I have reviewed the patient's current medications. Prior to Admission:  No prescriptions prior to admission   Scheduled: . folic acid  1 mg Oral Daily  . LORazepam  0-4 mg Oral Q6H   Followed by  . [START ON 04/17/2013] LORazepam  0-4 mg Oral Q12H  . multivitamin with minerals  1 tablet Oral Daily  . sodium chloride  3 mL Intravenous Q12H  . thiamine  100 mg Intravenous Daily  . thiamine  100 mg Oral Daily   Or  . thiamine  100 mg Intravenous Daily    ROS: History obtained from the  patient  General ROS: negative for - chills, fatigue, fever, night sweats, weight gain or weight loss Psychological ROS: negative for - behavioral disorder, hallucinations, memory difficulties, mood swings or suicidal ideation Ophthalmic ROS: negative for - blurry vision, double vision, eye pain or loss of vision ENT ROS: negative for - epistaxis, nasal discharge, oral lesions, sore throat, tinnitus or vertigo Allergy and Immunology ROS: negative for - hives or itchy/watery eyes Hematological and Lymphatic ROS: negative for - bleeding problems, bruising or swollen lymph nodes Endocrine ROS: negative for - galactorrhea, hair pattern changes, polydipsia/polyuria or temperature intolerance Respiratory ROS: negative for - cough, hemoptysis, shortness of breath or wheezing Cardiovascular ROS: negative for - chest pain, dyspnea on exertion, edema or irregular heartbeat Gastrointestinal ROS: negative for - abdominal pain, diarrhea, hematemesis, nausea/vomiting or stool incontinence Genito-Urinary ROS: negative for - dysuria, hematuria, incontinence or urinary frequency/urgency Musculoskeletal ROS: cramping and generalized weakness Neurological ROS: as noted in HPI Dermatological ROS: multiple scars  Physical Examination: Blood pressure 147/78, pulse 78, temperature 99.2 F (37.3 C), temperature source Oral, resp. rate 18, height 5\' 11"  (1.803 m), weight 67.2 kg (148 lb 2.4 oz), SpO2 94.00%.  Neurologic Examination Mental Status: Alert, oriented, thought content appropriate.  Speech fluent without  evidence of aphasia.  Able to follow 3 step commands without difficulty. Cranial Nerves: II: Discs flat bilaterally; Visual fields grossly normal, pupils equal, round, reactive to light and accommodation III,IV, VI: ptosis not present, extra-ocular motions intact bilaterally V,VII: smile symmetric, facial light touch sensation normal bilaterally VIII: hearing normal bilaterally IX,X: gag reflex  present XI: bilateral shoulder shrug XII: midline tongue extension Motor: Right : Upper extremity   5/5    Left:     Upper extremity   5/5  Lower extremity   5/5     Lower extremity   5/5 Tone and bulk:normal tone throughout; no atrophy noted Sensory: Pinprick and light touch intact throughout, bilaterally Deep Tendon Reflexes: 2+ and symmetric with trace ankle jerks bilaterally Plantars: Right: downgoing   Left: downgoing Cerebellar: normal finger-to-nose with an action tremor noted in both upper extremity (patient reports is not new), and normal heel-to-shin test Gait: not tested CV: pulses palpable throughout    Laboratory Studies:   Basic Metabolic Panel:  Recent Labs Lab 04/14/13 1910  NA 133*  K 3.9  CL 91*  CO2 13*  GLUCOSE 103*  BUN 9  CREATININE 0.90  CALCIUM 9.5    Liver Function Tests:  Recent Labs Lab 04/14/13 1910  AST 135*  ALT 49  ALKPHOS 50  BILITOT 0.6  PROT 9.1*  ALBUMIN 4.6   No results found for this basename: LIPASE, AMYLASE,  in the last 168 hours No results found for this basename: AMMONIA,  in the last 168 hours  CBC:  Recent Labs Lab 04/14/13 1910  WBC 9.6  NEUTROABS 8.0*  HGB 13.6  HCT 38.5*  MCV 92.3  PLT 119*    Cardiac Enzymes:  Recent Labs Lab 04/14/13 2055 04/14/13 2249  CKTOTAL 2196*  --   TROPONINI  --  <0.30    BNP: No components found with this basename: POCBNP,   CBG: No results found for this basename: GLUCAP,  in the last 168 hours  Microbiology: No results found for this or any previous visit.  Coagulation Studies: No results found for this basename: LABPROT, INR,  in the last 72 hours  Urinalysis:  Recent Labs Lab 04/14/13 2120  COLORURINE YELLOW  LABSPEC 1.015  PHURINE 5.5  GLUCOSEU NEGATIVE  HGBUR LARGE*  BILIRUBINUR NEGATIVE  KETONESUR 15*  PROTEINUR 100*  UROBILINOGEN 0.2  NITRITE NEGATIVE  LEUKOCYTESUR NEGATIVE    Lipid Panel:  No results found for this basename: chol,  trig, hdl, cholhdl, vldl, ldlcalc    HgbA1C:  Lab Results  Component Value Date   HGBA1C  Value: 6.0 (NOTE)   The ADA recommends the following therapeutic goals for glycemic   control related to Hgb A1C measurement:   Goal of Therapy:   < 7.0% Hgb A1C   Action Suggested:  > 8.0% Hgb A1C   Ref:  Diabetes Care, 22, Suppl. 1, 1999 10/16/2007    Urine Drug Screen:     Component Value Date/Time   LABOPIA NONE DETECTED 04/14/2013 2120   COCAINSCRNUR NONE DETECTED 04/14/2013 2120   LABBENZ NONE DETECTED 04/14/2013 2120   AMPHETMU NONE DETECTED 04/14/2013 2120   THCU NONE DETECTED 04/14/2013 2120   LABBARB NONE DETECTED 04/14/2013 2120    Alcohol Level:  Recent Labs Lab 04/14/13 2248  ETH <11    Other results: EKG: sinus tachycardia at 106bpm.  Imaging: Ct Head Wo Contrast  04/14/2013   *RADIOLOGY REPORT*  Clinical Data: Seizure activity.  Witnessed whole body seizure.  CT HEAD  WITHOUT CONTRAST  Technique:  Contiguous axial images were obtained from the base of the skull through the vertex without contrast.  Comparison: None.  Findings: Mild motion artifact is present on the examination.  No gross mass lesion, mass effect, midline shift, hydrocephalus, or hemorrhage.  The calvarium appears intact without grossly displaced skull fractures.  No acute infarct is identified.  Motion artifact is because of the patient with shaking during examination.  There is atrophy which appears slightly age advanced. Old right medial orbital wall blowout fracture.  Mild maxillary sinus disease.  IMPRESSION:  Nonspecific atrophy without gross acute intracranial abnormality.   Original Report Authenticated By: Andreas Newport, M.D.     Assessment/Plan: 57 year old male with seizures.  Has had alcohol withdrawal seizures in the past.  Patient continues to drink although he reports not that frequently.  LFT's elevated.  ETOH level less than 11.  Patient tremulous.  Head CT reviewed and shows no acute abnormalities.  Seizures  today may very well have been provoked by dehydration and withdrawal.  Would not start anticonvulsant therapy at this time but continue with work up.  Recommendations: 1.  Seizure precautions 2.  CIWA protocol 3.  EEG  4.  MRI of the brain with and without contrast 5.  No antiepileptic therapy at this time.  Will determine need after complete work up.     6.  Ativan prn 7.  Patient unable to drive, operate heavy machinery, perform activities at heights and participate in water activities until release by outpatient physician. 8.  Magnesium, phosphorus  Thana Farr, MD [facility] [phone] 04/15/2013, 1:06 AM

## 2013-04-15 NOTE — Progress Notes (Signed)
[facility] PROGRESS NOTE  Raymond [name] ZHY:[id number] DOB: [birth year] DOA: 04/14/2013 PCP: No PCP Per Patient  Assessment/Plan: 1. Seizure - ? From alcohol withdrawal - MRI, EEG pending  2. Hyponatremia - from dehydration - continue iv fluids  3. Hypokalemia - etoh and heat - replete  4. Hypophosphatemia - etoh related and probably rhabdomyolysis related - replete  5. Alcohol withdrawal syndrome  6. Rhabdomyolysis  - c/w iv fluids   Code Status: full Family Communication: patient  (indicate person spoken with, relationship, and if by phone, the number) Disposition Plan: home    Consultants:  Neurology   Procedures:    HPI/Subjective: Feels better   Objective: Filed Vitals:   04/15/13 0100 04/15/13 0431 04/15/13 0434 04/15/13 0607  BP:  179/82 162/70 153/88  Pulse:    79  Temp: 99.5 F (37.5 C)   99 F (37.2 C)  TempSrc: Oral   Oral  Resp:    18  Height:      Weight:      SpO2:    100%    Intake/Output Summary (Last 24 hours) at 04/15/13 0719 Last data filed at 04/15/13 0011  Gross per 24 hour  Intake   2003 ml  Output      0 ml  Net   2003 ml   Filed Weights   04/14/13 2355  Weight: 67.2 kg (148 lb 2.4 oz)    Exam:   General:  axox3  Cardiovascular: rrr  Respiratory: ctab   Abdomen: soft, nt  Musculoskeletal: intact    Data Reviewed: Basic Metabolic Panel:  Recent Labs Lab 04/14/13 1910  NA 133*  K 3.9  CL 91*  CO2 13*  GLUCOSE 103*  BUN 9  CREATININE 0.90  CALCIUM 9.5   Liver Function Tests:  Recent Labs Lab 04/14/13 1910  AST 135*  ALT 49  ALKPHOS 50  BILITOT 0.6  PROT 9.1*  ALBUMIN 4.6   No results found for this basename: LIPASE, AMYLASE,  in the last 168 hours No results found for this basename: AMMONIA,  in the last 168 hours CBC:  Recent Labs Lab 04/14/13 1910 04/15/13 0615  WBC 9.6 8.2  NEUTROABS 8.0* 6.5  HGB 13.6 12.4*  HCT 38.5* 35.6*  MCV 92.3 90.4  PLT 119* 111*   Cardiac  Enzymes:  Recent Labs Lab 04/14/13 2055 04/14/13 2249  CKTOTAL 2196*  --   TROPONINI  --  <0.30   BNP (last 3 results) No results found for this basename: PROBNP,  in the last 8760 hours CBG:  Recent Labs Lab 04/15/13 0620  GLUCAP 91    No results found for this or any previous visit (from the past 240 hour(s)).   Studies: Ct Head Wo Contrast  04/14/2013   *RADIOLOGY REPORT*  Clinical Data: Seizure activity.  Witnessed whole body seizure.  CT HEAD WITHOUT CONTRAST  Technique:  Contiguous axial images were obtained from the base of the skull through the vertex without contrast.  Comparison: None.  Findings: Mild motion artifact is present on the examination.  No gross mass lesion, mass effect, midline shift, hydrocephalus, or hemorrhage.  The calvarium appears intact without grossly displaced skull fractures.  No acute infarct is identified.  Motion artifact is because of the patient with shaking during examination.  There is atrophy which appears slightly age advanced. Old right medial orbital wall blowout fracture.  Mild maxillary sinus disease.  IMPRESSION:  Nonspecific atrophy without gross acute intracranial abnormality.   Original Report Authenticated By:  Andreas Newport, M.D.    Scheduled Meds: . folic acid  1 mg Oral Daily  . LORazepam  0-4 mg Oral Q6H   Followed by  . [START ON 04/17/2013] LORazepam  0-4 mg Oral Q12H  . multivitamin with minerals  1 tablet Oral Daily  . sodium chloride  3 mL Intravenous Q12H  . thiamine  100 mg Intravenous Daily  . thiamine  100 mg Oral Daily   Or  . thiamine  100 mg Intravenous Daily   Continuous Infusions: . sodium chloride 1,000 mL (04/15/13 0010)    Principal Problem:   Convulsions Active Problems:   Rhabdomyolysis   Metabolic acidosis      Raymond [name]  [facility] Pager [phone]. If 7PM-7AM, please contact night-coverage at [email or web address], password [facility] 04/15/2013, 7:19 AM  LOS: 1 day

## 2013-04-15 NOTE — Progress Notes (Signed)
Pt remains very impulsive, gets up without using call light, doesn't seem to remember that he had been instructed to do so.  No complaints, but somewhat confused. Stated it was April, but got the day correct. Requesting to go to his apartment, and then come back explained that he couldn't do that.

## 2013-04-15 NOTE — Progress Notes (Signed)
0430: Overheard a noise from pt's room, entered and saw pt seizing. Called for help. Safety parameters for pt applied, suction, and oxygen delivered. 1mg  of ativan given IV push. Pt seized for approximately 2-2.5 min. Post-ictal pt was disoriented, not responding. 20 later pt was able to state name and place, but not situation and time. Pt has had 5-6 loose stools since arriving on the unit. Sent stool sample to lab.  Pt is cleaned, alert and oriented x 4 0530. Continuing to monitor.  [place], Lamiya Naas N, RN

## 2013-04-16 ENCOUNTER — Inpatient Hospital Stay (HOSPITAL_COMMUNITY): Payer: Self-pay

## 2013-04-16 LAB — MAGNESIUM: Magnesium: 1.8 mg/dL (ref 1.5–2.5)

## 2013-04-16 LAB — BASIC METABOLIC PANEL
BUN: 3 mg/dL — ABNORMAL LOW (ref 6–23)
CO2: 24 mEq/L (ref 19–32)
Calcium: 9.3 mg/dL (ref 8.4–10.5)
Creatinine, Ser: 0.54 mg/dL (ref 0.50–1.35)
GFR calc Af Amer: >90 mL/min (ref >90)

## 2013-04-16 MED ORDER — SODIUM CHLORIDE 0.9 % IV SOLN
INTRAVENOUS | Status: AC
  Administered 2013-04-16: 10:00:00 via INTRAVENOUS

## 2013-04-16 MED ORDER — LOPERAMIDE HCL 2 MG PO CAPS
2.0000 mg | ORAL_CAPSULE | ORAL | Status: DC | PRN
  Filled 2013-04-16: qty 1

## 2013-04-16 MED ORDER — POTASSIUM CHLORIDE CRYS ER 20 MEQ PO TBCR
40.0000 meq | EXTENDED_RELEASE_TABLET | Freq: Three times a day (TID) | ORAL | Status: AC
  Administered 2013-04-16 (×3): 40 meq via ORAL
  Filled 2013-04-16 (×3): qty 2

## 2013-04-16 MED ORDER — WHITE PETROLATUM GEL
Status: AC
  Administered 2013-04-16: 0.2
  Filled 2013-04-16: qty 5

## 2013-04-16 NOTE — Progress Notes (Signed)
NEURO HOSPITALIST PROGRESS NOTE   SUBJECTIVE:                                                                                                                        No complaints --no further seizures. Patient was restless over night.   OBJECTIVE:                                                                                                                           Vital signs in last 24 hours: Temp:  [98 F (36.7 C)-99.5 F (37.5 C)] 98.5 F (36.9 C) (06/06 0609) Pulse Rate:  [76-95] 79 (06/06 0609) Resp:  [16-18] 16 (06/06 0609) BP: (136-172)/(74-102) 172/102 mmHg (06/06 0609) SpO2:  [99 %-100 %] 100 % (06/06 0609)  Intake/Output from previous day: 06/05 0701 - 06/06 0700 In: 540 [P.O.:540] Out: 776 [Urine:775; Emesis/NG output:1] Intake/Output this shift: Total I/O In: 240 [P.O.:240] Out: -  Nutritional status: General  Past Medical History  Diagnosis Date  . Seizures      Neurologic Exam:  Mental Status: Alert, confused, believes he is at [employer] even after I corrected him.  He knows the year 2014 and month of June.   Speech fluent without evidence of aphasia.  Able to follow 3 step commands without difficulty. Cranial Nerves: II: Visual fields grossly normal, pupils equal, round, reactive to light and accommodation III,IV, VI: ptosis not present, extra-ocular motions intact bilaterally V,VII: smile symmetric, facial light touch sensation normal bilaterally VIII: hearing normal bilaterally IX,X: gag reflex present XI: bilateral shoulder shrug XII: midline tongue extension Motor: Right : Upper extremity   5/5    Left:     Upper extremity   5/5  Lower extremity   5/5     Lower extremity   5/5 --tremor when hands are held out stretched.  Tone and bulk:normal tone throughout; no atrophy noted Sensory: Pinprick and light touch intact throughout, bilaterally Deep Tendon Reflexes: 2+ and symmetric  throughout Plantars: Right: downgoing   Left: downgoing Cerebellar: normal finger-to-nose,  normal heel-to-shin test CV: pulses palpable throughout    Lab Results: No results found for this basename: cbc, bmp, coags, chol, tri, ldl, hga1c   Lipid Panel No results found for  this basename: CHOL, TRIG, HDL, CHOLHDL, VLDL, LDLCALC,  in the last 72 hours  Studies/Results: Ct Head Wo Contrast  04/14/2013   *RADIOLOGY REPORT*  Clinical Data: Seizure activity.  Witnessed whole body seizure.  CT HEAD WITHOUT CONTRAST  Technique:  Contiguous axial images were obtained from the base of the skull through the vertex without contrast.  Comparison: None.  Findings: Mild motion artifact is present on the examination.  No gross mass lesion, mass effect, midline shift, hydrocephalus, or hemorrhage.  The calvarium appears intact without grossly displaced skull fractures.  No acute infarct is identified.  Motion artifact is because of the patient with shaking during examination.  There is atrophy which appears slightly age advanced. Old right medial orbital wall blowout fracture.  Mild maxillary sinus disease.  IMPRESSION:  Nonspecific atrophy without gross acute intracranial abnormality.   Original Report Authenticated By: Andreas Newport, M.D.   [name] Wo Contrast  04/16/2013   *RADIOLOGY REPORT*  Clinical Data: 57 year old male with possible seizure, possible alcohol withdrawal.  Sedated, but agitated.  MRI HEAD WITHOUT AND WITH CONTRAST  Technique:  Multiplanar, multiecho pulse sequences of the brain and surrounding structures were obtained according to standard protocol without and with intravenous contrast  Contrast: 15mL MULTIHANCE GADOBENATE DIMEGLUMINE 529 MG/ML IV SOLN  Comparison: Head CTs 04/14/2013 and earlier.  Findings: Age advanced cerebral volume loss, appears generalized but possibly affecting the supratentorial brain to a greater extent than the posterior fossa.  No restricted diffusion to suggest  acute infarction.  No midline shift, mass effect, evidence of mass lesion, ventriculomegaly, extra-axial collection or acute intracranial hemorrhage. Cervicomedullary junction and pituitary are within normal limits. Major intracranial vascular flow voids are preserved.  Wrap and motion artifact on postcontrast images. No abnormal enhancement identified.  No focal T2 or FLAIR signal abnormality in the brain. Negative visualized cervical spine.  Visualized bone marrow signal is within normal limits.  Visualized orbit soft tissues are within normal limits.  Small fluid level right maxillary sinus.  Other Visualized paranasal sinuses and mastoids are clear.  Negative scalp soft tissues.  IMPRESSION: 1. No acute intracranial abnormality. 2.  Nonspecific age advanced generalized cerebral volume loss. 3.  Mild inflammatory changes right maxillary sinus.   Original Report Authenticated By: Erskine Speed, M.D.    MEDICATIONS                                                                                                                        Scheduled: . folic acid  1 mg Oral Daily  . LORazepam  0-4 mg Oral Q6H   Followed by  . [START ON 04/17/2013] LORazepam  0-4 mg Oral Q12H  . multivitamin with minerals  1 tablet Oral Daily  . phosphorus  500 mg Oral TID  . potassium chloride  40 mEq Oral TID  . sodium chloride  3 mL Intravenous Q12H  . thiamine  100 mg Oral Daily   Or  . thiamine  100  mg Intravenous Daily    ASSESSMENT/PLAN:                                                                                                            Seizure.  Likely ETOH withdrawal. MRI head showed no acute infarct. EEG pending.   Recommend: 1) Would not initiate AED unless EEG shows focus or active seizure.  2) CIWA 3) Patient unable to drive, operate heavy machinery, perform activities at heights and participate in water activities until release by outpatient physician. 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    Felicie Morn PA-C [facility] [phone]  04/16/2013, 8:48 AM   Addendum: EEG normal. Likely alcohol related seizures, no need to initiate AED. Will sign off. Please, call with questions, concerns, new neurological developments.  Wyatt Portela, MD

## 2013-04-16 NOTE — Progress Notes (Addendum)
Pt remained restless and impulsive throughout the night despite the presence of a Recruitment consultant.  Pt is still confused, only oriented to self.  Ptwas re-educated about fall prevention, still no evidence of learning.

## 2013-04-16 NOTE — Progress Notes (Signed)
[facility] PROGRESS NOTE  Raymond [name] ZOX:[id number] DOB: Mar 01, 1956 DOA: 04/14/2013 PCP: No PCP Per Patient  Assessment/Plan: 1. Seizure - ? From alcohol withdrawal - MRI, EEG pending . Neurology following to make final recs regarding AEDs  2. Hyponatremia - from dehydration - resolved with  iv fluids  3. Hypokalemia - secondary to etoh and heat - repleted and follow   4. Hypophosphatemia - etoh related and probably rhabdomyolysis related - replete  5. Alcohol withdrawal syndrome - c/w thiamine and folate and ativan prn  6. Rhabdomyolysis  - received iv fluids and renal function is Ok  7. Mild transaminitis - most likely from rhabdo   Code Status: full Family Communication: patient   Disposition Plan: home    Consultants:  Neurology   Procedures:  MRI  EEG  HPI/Subjective: Confused   Objective: Filed Vitals:   04/15/13 1816 04/15/13 2013 04/15/13 2123 04/16/13 0609  BP: 159/89 167/89 154/92 172/102  Pulse: 95 76 88 79  Temp: 98 F (36.7 C) 99.5 F (37.5 C) 98.4 F (36.9 C) 98.5 F (36.9 C)  TempSrc: Oral Oral Oral Oral  Resp: 18 18 18 16   Height:      Weight:      SpO2: 99% 100% 100% 100%    Intake/Output Summary (Last 24 hours) at 04/16/13 0836 Last data filed at 04/16/13 8119  Gross per 24 hour  Intake    480 ml  Output    775 ml  Net   -295 ml   Filed Weights   04/14/13 2355  Weight: 67.2 kg (148 lb 2.4 oz)    Exam:   General:  Not oriented anymore, tremors, getting more confused  Cardiovascular: rrr, tachycardic   Respiratory: ctab   Abdomen: soft, nt  Musculoskeletal: intact    Data Reviewed: Basic Metabolic Panel:  Recent Labs Lab 04/14/13 1910 04/15/13 0615 04/16/13 0610  NA 133* 134* 137  K 3.9 3.2* 3.2*  CL 91* 97 99  CO2 13* 25 24  GLUCOSE 103* 89 87  BUN 9 4* 3*  CREATININE 0.90 0.67 0.54  CALCIUM 9.5 9.0 9.3  MG  --  1.8 1.8  PHOS  --  1.3* 2.1*   Liver Function Tests:  Recent Labs Lab 04/14/13 1910  04/15/13 0615  AST 135* 170*  ALT 49 49  ALKPHOS 50 45  BILITOT 0.6 1.2  PROT 9.1* 8.0  ALBUMIN 4.6 4.0   No results found for this basename: LIPASE, AMYLASE,  in the last 168 hours No results found for this basename: AMMONIA,  in the last 168 hours CBC:  Recent Labs Lab 04/14/13 1910 04/15/13 0615  WBC 9.6 8.2  NEUTROABS 8.0* 6.5  HGB 13.6 12.4*  HCT 38.5* 35.6*  MCV 92.3 90.4  PLT 119* 111*   Cardiac Enzymes:  Recent Labs Lab 04/14/13 2055 04/14/13 2249 04/15/13 0615  CKTOTAL 2196*  --  4353*  TROPONINI  --  <0.30  --    BNP (last 3 results) No results found for this basename: PROBNP,  in the last 8760 hours CBG:  Recent Labs Lab 04/15/13 0620  GLUCAP 91    Recent Results (from the past 240 hour(s))  CLOSTRIDIUM DIFFICILE BY PCR     Status: None   Collection Time    04/15/13  5:19 AM      Result Value Range Status   C difficile by pcr NEGATIVE  NEGATIVE Final     Studies: Ct Head Wo Contrast  04/14/2013   *  RADIOLOGY REPORT*  Clinical Data: Seizure activity.  Witnessed whole body seizure.  CT HEAD WITHOUT CONTRAST  Technique:  Contiguous axial images were obtained from the base of the skull through the vertex without contrast.  Comparison: None.  Findings: Mild motion artifact is present on the examination.  No gross mass lesion, mass effect, midline shift, hydrocephalus, or hemorrhage.  The calvarium appears intact without grossly displaced skull fractures.  No acute infarct is identified.  Motion artifact is because of the patient with shaking during examination.  There is atrophy which appears slightly age advanced. Old right medial orbital wall blowout fracture.  Mild maxillary sinus disease.  IMPRESSION:  Nonspecific atrophy without gross acute intracranial abnormality.   Original Report Authenticated By: Andreas Newport, M.D.    Scheduled Meds: . folic acid  1 mg Oral Daily  . LORazepam  0-4 mg Oral Q6H   Followed by  . [START ON 04/17/2013] LORazepam   0-4 mg Oral Q12H  . multivitamin with minerals  1 tablet Oral Daily  . phosphorus  500 mg Oral TID  . potassium chloride  40 mEq Oral TID  . sodium chloride  3 mL Intravenous Q12H  . thiamine  100 mg Oral Daily   Or  . thiamine  100 mg Intravenous Daily   Continuous Infusions: . sodium chloride      Principal Problem:   Convulsions Active Problems:   Rhabdomyolysis   Metabolic acidosis   Hypokalemia      Colette Dicamillo  [facility] Pager [phone]. If 7PM-7AM, please contact night-coverage at [email or web address], password [facility] 04/16/2013, 8:36 AM  LOS: 2 days

## 2013-04-16 NOTE — Progress Notes (Signed)
EEG Completed; Results Pending  

## 2013-04-16 NOTE — Procedures (Signed)
EEG report.  Brief clinical history:57 year old male with seizures. Has had alcohol withdrawal seizures in the past. Patient continues to drink although he reports not that frequently  Technique: this is a 17 channel routine scalp EEG performed at the bedside with bipolar and monopolar montages arranged in accordance to the international 10/20 system of electrode placement. One channel was dedicated to EKG recording.  The study was performed during wakefulness, drowsiness, and stage 2 sleep. No activating procedures performed.  Description:In the wakeful state, the best background consisted of a low amplitude, posterior dominant, well sustained, symmetric and reactive 10 Hz rhythm. Drowsiness demonstrated dropout of the alpha rhythm. Stage 2 sleep showed symmetric and synchronous sleep spindles without intermixed epileptiform discharges.   No focal or generalized epileptiform discharges noted.  No slowing seen.  EKG showed sinus rhythm.  Impression: this is a normal awake and asleep EEG. Please, be aware that a normal EEG does not exclude the possibility of epilepsy.  Clinical correlation is advised.  Wyatt Portela, MD

## 2013-04-17 DIAGNOSIS — F1020 Alcohol dependence, uncomplicated: Secondary | ICD-10-CM

## 2013-04-17 DIAGNOSIS — F1010 Alcohol abuse, uncomplicated: Secondary | ICD-10-CM

## 2013-04-17 LAB — BASIC METABOLIC PANEL
BUN: 5 mg/dL — ABNORMAL LOW (ref 6–23)
Chloride: 101 mEq/L (ref 96–112)
Creatinine, Ser: 0.49 mg/dL — ABNORMAL LOW (ref 0.50–1.35)
GFR calc Af Amer: >90 mL/min (ref >90)
Glucose, Bld: 98 mg/dL (ref 70–99)

## 2013-04-17 NOTE — Evaluation (Signed)
Physical Therapy Evaluation Patient Details Name: Raymond [name] MRN: [id number] DOB: 11/10/1956 Today's Date: 04/17/2013 Time: 4098-1191 PT Time Calculation (min): 22 min  PT Assessment / Plan / Recommendation Clinical Impression   57 yo M admitted with seizures. Presents to PT likely close to baseline level of function.  Pt ambulated about 1500 feet without difficulty and no reports of fatigue.  No further PT needs, PT to sign off.  Encouraged pt to ambulate more, but only with nursing staff.  Pt somewhat impulsive.      PT Assessment  Patent does not need any further PT services    Follow Up Recommendations  No PT follow up    Does the patient have the potential to tolerate intense rehabilitation      Barriers to Discharge        Equipment Recommendations  None recommended by PT (pt states he may want a cane)    Recommendations for Other Services     Frequency      Precautions / Restrictions Precautions Precautions: Fall Restrictions Weight Bearing Restrictions: No   Pertinent Vitals/Pain C/o stiff neck, but no pain      Mobility  Bed Mobility Bed Mobility: Supine to Sit Supine to Sit: 7: Independent Transfers Transfers: Sit to Stand;Stand to Sit Sit to Stand: 6: Modified independent (Device/Increase time);From bed;From chair/3-in-1 Stand to Sit: 6: Modified independent (Device/Increase time);Without upper extremity assist;To chair/3-in-1 Ambulation/Gait Ambulation/Gait Assistance: 6: Modified independent (Device/Increase time) Ambulation Distance (Feet): 500 Feet (pt slo ambualted about another 1000 feet) Assistive device: Rolling walker (also ambulated with no device majority of time) Gait Pattern: Step-through pattern Gait velocity: WNL General Gait Details: No balance losses throughtout. Pt also performed LE stretching in hallway Stairs: No    Exercises     PT Diagnosis:    PT Problem List:   PT Treatment Interventions:     PT Goals    Visit  Information  Last PT Received On: 04/17/13 Assistance Needed: +1    Subjective Data  Patient Stated Goal: to go pick up his work check   Prior Functioning  Home Living Lives With: Alone Available Help at Discharge: Other (Comment) (none) Type of Home: Apartment Home Access: Elevator Home Layout: One level Home Adaptive Equipment: None Prior Function Level of Independence: Independent Able to Take Stairs?: Yes Driving:  (takes bus) Vocation: Part time 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, advertising Arousal/Alertness: Awake/alert Behavior During Therapy: Impulsive Overall Cognitive Status: Within Functional Limits for tasks assessed    Extremity/Trunk Assessment Right Upper Extremity Assessment RUE ROM/Strength/Tone: [facility] for tasks assessed Left Upper Extremity Assessment LUE ROM/Strength/Tone: WFL for tasks assessed Right Lower Extremity Assessment RLE ROM/Strength/Tone: WFL for tasks assessed Left Lower Extremity Assessment LLE ROM/Strength/Tone: WFL for tasks assessed Trunk Assessment Trunk Assessment: Normal   Balance Balance Balance Assessed: Yes High Level Balance High Level Balance Activites: Direction changes;Turns;Head turns (no balance losses with high level balance activities. ) High Level Balance Comments: Stood on one foot to remove cell phone from sock  End of Session PT - End of Session Equipment Utilized During Treatment: Gait belt Activity Tolerance: Patient tolerated treatment well Patient left: in chair;with nursing in room (no phone in room,pt asked to sit at phone outside room w nsg) Nurse Communication: Mobility status;Other (comment) (ambulate with nursing staff)  GP     Donnella Sham 04/17/2013, 2:04 PM [place]ona Mound, PT  [phone] 04/17/2013

## 2013-04-17 NOTE — Progress Notes (Signed)
[facility] PROGRESS NOTE  Raymond [name] WUJ:[id number] DOB: 13-Feb-1956 DOA: 04/14/2013 PCP: No PCP Per Patient  Assessment/Plan: 1. Seizure - ? From alcohol withdrawal - MRI, EEG wnl . Neurology team did not recommend AEDs  2. Hyponatremia - from dehydration - resolved with  iv fluids  3. Hypokalemia - secondary to etoh and heat - repleted and follow   4. Hypophosphatemia - etoh related and probably rhabdomyolysis related - replete  5. Alcohol withdrawal syndrome - c/w thiamine and folate and ativan prn  6. Rhabdomyolysis  - received iv fluids and renal function is Ok  7. Mild transaminitis - most likely from rhabdo  8. Generalized weakness - PT consult   Code Status: full Family Communication: patient   Disposition Plan: home    Consultants:  Neurology   Procedures:  MRI  EEG  HPI/Subjective: No more confusion    Objective: Filed Vitals:   04/16/13 1400 04/16/13 1832 04/16/13 2200 04/17/13 0600  BP: 136/88 149/87 162/96 149/90  Pulse: 85 79 68 105  Temp: 98.6 F (37 C) 98.7 F (37.1 C) 98.9 F (37.2 C) 98.3 F (36.8 C)  TempSrc: Oral Oral Oral Oral  Resp: 18 16 18 18   Height:      Weight:      SpO2: 100% 100% 100% 99%    Intake/Output Summary (Last 24 hours) at 04/17/13 0921 Last data filed at 04/17/13 0834  Gross per 24 hour  Intake   1443 ml  Output    500 ml  Net    943 ml   Filed Weights   04/14/13 2355  Weight: 67.2 kg (148 lb 2.4 oz)    Exam:   General:  axox3  Cardiovascular: rrr, tachycardic   Respiratory: ctab   Abdomen: soft, nt  Musculoskeletal: intact    Data Reviewed: Basic Metabolic Panel:  Recent Labs Lab 04/14/13 1910 04/15/13 0615 04/16/13 0610 04/17/13 0425  NA 133* 134* 137 135  K 3.9 3.2* 3.2* 3.9  CL 91* 97 99 101  CO2 13* 25 24 24   GLUCOSE 103* 89 87 98  BUN 9 4* 3* 5*  CREATININE 0.90 0.67 0.54 0.49*  CALCIUM 9.5 9.0 9.3 9.4  MG  --  1.8 1.8  --   PHOS  --  1.3* 2.1*  --    Liver Function  Tests:  Recent Labs Lab 04/14/13 1910 04/15/13 0615  AST 135* 170*  ALT 49 49  ALKPHOS 50 45  BILITOT 0.6 1.2  PROT 9.1* 8.0  ALBUMIN 4.6 4.0   No results found for this basename: LIPASE, AMYLASE,  in the last 168 hours No results found for this basename: AMMONIA,  in the last 168 hours CBC:  Recent Labs Lab 04/14/13 1910 04/15/13 0615  WBC 9.6 8.2  NEUTROABS 8.0* 6.5  HGB 13.6 12.4*  HCT 38.5* 35.6*  MCV 92.3 90.4  PLT 119* 111*   Cardiac Enzymes:  Recent Labs Lab 04/14/13 2055 04/14/13 2249 04/15/13 0615 04/17/13 0425  CKTOTAL 2196*  --  4353* 2894*  TROPONINI  --  <0.30  --   --    BNP (last 3 results) No results found for this basename: PROBNP,  in the last 8760 hours CBG:  Recent Labs Lab 04/15/13 0620  GLUCAP 91    Recent Results (from the past 240 hour(s))  CLOSTRIDIUM DIFFICILE BY PCR     Status: None   Collection Time    04/15/13  5:19 AM      Result Value Range  Status   C difficile by pcr NEGATIVE  NEGATIVE Final  STOOL CULTURE     Status: None   Collection Time    04/15/13  5:19 AM      Result Value Range Status   Specimen Description STOOL   Final   Special Requests Normal   Final   Culture NO SUSPICIOUS COLONIES, CONTINUING TO HOLD   Final   Report Status PENDING   Incomplete     Studies: [name] Wo Contrast  04-20-13   *RADIOLOGY REPORT*  Clinical Data: 57 year old male with possible seizure, possible alcohol withdrawal.  Sedated, but agitated.  MRI HEAD WITHOUT AND WITH CONTRAST  Technique:  Multiplanar, multiecho pulse sequences of the brain and surrounding structures were obtained according to standard protocol without and with intravenous contrast  Contrast: 15mL MULTIHANCE GADOBENATE DIMEGLUMINE 529 MG/ML IV SOLN  Comparison: Head CTs 04/14/2013 and earlier.  Findings: Age advanced cerebral volume loss, appears generalized but possibly affecting the supratentorial brain to a greater extent than the posterior fossa.  No  restricted diffusion to suggest acute infarction.  No midline shift, mass effect, evidence of mass lesion, ventriculomegaly, extra-axial collection or acute intracranial hemorrhage. Cervicomedullary junction and pituitary are within normal limits. Major intracranial vascular flow voids are preserved.  Wrap and motion artifact on postcontrast images. No abnormal enhancement identified.  No focal T2 or FLAIR signal abnormality in the brain. Negative visualized cervical spine.  Visualized bone marrow signal is within normal limits.  Visualized orbit soft tissues are within normal limits.  Small fluid level right maxillary sinus.  Other Visualized paranasal sinuses and mastoids are clear.  Negative scalp soft tissues.  IMPRESSION: 1. No acute intracranial abnormality. 2.  Nonspecific age advanced generalized cerebral volume loss. 3.  Mild inflammatory changes right maxillary sinus.   Original Report Authenticated By: Erskine Speed, M.D.    Scheduled Meds: . folic acid  1 mg Oral Daily  . LORazepam  0-4 mg Oral Q12H  . multivitamin with minerals  1 tablet Oral Daily  . phosphorus  500 mg Oral TID  . sodium chloride  3 mL Intravenous Q12H  . thiamine  100 mg Oral Daily   Continuous Infusions:    Principal Problem:   Convulsions Active Problems:   Rhabdomyolysis   Metabolic acidosis   Hypokalemia      Ole Lafon  [facility] Pager [phone]. If 7PM-7AM, please contact night-coverage at [email or web address], password [facility] 04/17/2013, 9:21 AM  LOS: 3 days

## 2013-04-17 NOTE — Consult Note (Signed)
Reason for Consult: Alcohol abuse versus dependence and recent history of seizures Referring Physician: Dr. Georgia Dom Coke is an 57 y.o. male.  HPI: Patient is seen and chart reviewed. Patient was admitted to [facility] cone medical floor secondary to seizure at his workplace,  was found to have sudden loss of consciousness with jerking movements of both upper and lower extremities as witnessed by patient's colleagues.  patient has been worked up for seizures and treated with adequate abnormalities. Patient has no current alcohol withdrawal symptoms unable to walk steady on his feet in his room. Patient has no GI symptoms, tremors or delirium tremens. Patient has a history of low seizures long time ago. Patient has a limited family support is one of the 5 siblings alive and reportedly has one sibling in [place] and other sore out of the state. Patient reportedly dropped out of the college in 1983 and never able to now able to complete Diploma.   Patient states he drinks alcohol  especially beer 2x24 ounces 3 times a week and denied getting drunk having serious symptoms related to alcohol. Patient has no history of acute psychiatric hospitalization.    Mental Status Examination: Patient appeared disheveled, unshaven beard, dressed in hospital scrubs, sat in a chair with a sitter next to him. He has poorly groomed and maintaining good eye contact. Patient has fine mood mood and his affect was constricted. He has normal rate, rhythm, and volume of speech. His thought process is linear and goal directed. Patient has denied suicidal, homicidal ideations, intentions or plans. Patient has no evidence of auditory or visual hallucinations, delusions, and paranoia. Patient has fair insight judgment and impulse control.  Past Medical History  Diagnosis Date  . Seizures     Past Surgical History  Procedure Laterality Date  . Tonsillectomy      Family History  Problem Relation Age of Onset  . Other Neg  Hx     Social History:  reports that he has never smoked. He does not have any smokeless tobacco history on file. He reports that  drinks alcohol. He reports that he does not use illicit drugs.  Allergies: No Known Allergies  Medications: I have reviewed the patient's current medications.  Results for orders placed during the hospital encounter of 04/14/13 (from the past 48 hour(s))  BASIC METABOLIC PANEL     Status: Abnormal   Collection Time    04/16/13  6:10 AM      Result Value Range   Sodium 137  135 - 145 mEq/L   Potassium 3.2 (*) 3.5 - 5.1 mEq/L   Chloride 99  96 - 112 mEq/L   CO2 24  19 - 32 mEq/L   Glucose, Bld 87  70 - 99 mg/dL   BUN 3 (*) 6 - 23 mg/dL   Creatinine, Ser 1.61  0.50 - 1.35 mg/dL   Calcium 9.3  8.4 - 09.6 mg/dL   GFR calc non Af Amer >90  >90 mL/min   GFR calc Af Amer >90  >90 mL/min   Comment:            The eGFR has been calculated     using the CKD EPI equation.     This calculation has not been     validated in all clinical     situations.     eGFR's persistently     <90 mL/min signify     possible Chronic Kidney Disease.  MAGNESIUM  Status: None   Collection Time    04/16/13  6:10 AM      Result Value Range   Magnesium 1.8  1.5 - 2.5 mg/dL  PHOSPHORUS     Status: Abnormal   Collection Time    04/16/13  6:10 AM      Result Value Range   Phosphorus 2.1 (*) 2.3 - 4.6 mg/dL  BASIC METABOLIC PANEL     Status: Abnormal   Collection Time    04/17/13  4:25 AM      Result Value Range   Sodium 135  135 - 145 mEq/L   Potassium 3.9  3.5 - 5.1 mEq/L   Comment: DELTA CHECK NOTED     NO VISIBLE HEMOLYSIS   Chloride 101  96 - 112 mEq/L   CO2 24  19 - 32 mEq/L   Glucose, Bld 98  70 - 99 mg/dL   BUN 5 (*) 6 - 23 mg/dL   Creatinine, Ser 0.10 (*) 0.50 - 1.35 mg/dL   Calcium 9.4  8.4 - 27.2 mg/dL   GFR calc non Af Amer >90  >90 mL/min   GFR calc Af Amer >90  >90 mL/min   Comment:            The eGFR has been calculated     using the CKD EPI  equation.     This calculation has not been     validated in all clinical     situations.     eGFR's persistently     <90 mL/min signify     possible Chronic Kidney Disease.  CK     Status: Abnormal   Collection Time    04/17/13  4:25 AM      Result Value Range   Total CK 2894 (*) 7 - 232 U/L    [name] Wo Contrast  04/16/2013   *RADIOLOGY REPORT*  Clinical Data: 57 year old male with possible seizure, possible alcohol withdrawal.  Sedated, but agitated.  MRI HEAD WITHOUT AND WITH CONTRAST  Technique:  Multiplanar, multiecho pulse sequences of the brain and surrounding structures were obtained according to standard protocol without and with intravenous contrast  Contrast: 15mL MULTIHANCE GADOBENATE DIMEGLUMINE 529 MG/ML IV SOLN  Comparison: Head CTs 04/14/2013 and earlier.  Findings: Age advanced cerebral volume loss, appears generalized but possibly affecting the supratentorial brain to a greater extent than the posterior fossa.  No restricted diffusion to suggest acute infarction.  No midline shift, mass effect, evidence of mass lesion, ventriculomegaly, extra-axial collection or acute intracranial hemorrhage. Cervicomedullary junction and pituitary are within normal limits. Major intracranial vascular flow voids are preserved.  Wrap and motion artifact on postcontrast images. No abnormal enhancement identified.  No focal T2 or FLAIR signal abnormality in the brain. Negative visualized cervical spine.  Visualized bone marrow signal is within normal limits.  Visualized orbit soft tissues are within normal limits.  Small fluid level right maxillary sinus.  Other Visualized paranasal sinuses and mastoids are clear.  Negative scalp soft tissues.  IMPRESSION: 1. No acute intracranial abnormality. 2.  Nonspecific age advanced generalized cerebral volume loss. 3.  Mild inflammatory changes right maxillary sinus.   Original Report Authenticated By: Erskine Speed, M.D.    Positive for anxiety and alcohol  abuse Blood pressure 149/90, pulse 105, temperature 98.3 F (36.8 C), temperature source Oral, resp. rate 18, height 5\' 11"  (1.803 m), weight 148 lb 2.4 oz (67.2 kg), SpO2 99.00%.   Assessment/Plan: Alcohol abuse versus dependence  Recommendation: 1. Patient does not meet criteria for acute psychiatric hospitalization for alcohol detox treatment as he has no current symptoms 2. Patient will be referred to the alcohol rehabilitation services  in local community  3. Patient declined treatment in alcohol rehabilitation services saying he needed to work to pay his bills 4. Patient requested Bus pass to reach his apartment which can get provided by the social service 5. Appreciate psychiatric consultation and case discussed with Dr. Lavera Guise., And we'll sign off at this time.   Shamell Suarez,JANARDHAHA R. 04/17/2013, 4:07 PM

## 2013-04-18 DIAGNOSIS — E876 Hypokalemia: Secondary | ICD-10-CM

## 2013-04-18 DIAGNOSIS — E872 Acidosis: Secondary | ICD-10-CM

## 2013-04-18 DIAGNOSIS — M6282 Rhabdomyolysis: Secondary | ICD-10-CM

## 2013-04-18 DIAGNOSIS — R569 Unspecified convulsions: Secondary | ICD-10-CM

## 2013-04-18 LAB — STOOL CULTURE: Special Requests: NORMAL

## 2013-04-18 MED ORDER — THIAMINE HCL 100 MG PO TABS: 100.0000 mg | ORAL_TABLET | Freq: Every day | ORAL | Status: DC

## 2013-04-18 NOTE — Discharge Summary (Signed)
Physician Discharge Summary  Raymond [name] ZOX:[id number] DOB: 1956-03-14 DOA: 04/14/2013    Admit date: 04/14/2013 Discharge date: 04/18/2013  Time spent: 45 minutes    Discharge Diagnoses:  Seizures-felt to be from alcohol withdrawal   Rhabdomyolysis - Mild-resolved   Metabolic acidosis - Resolved    Hypokalemia  hypophosphatemia Chronic alcohol abuse Very mild alcohol withdrawal delirium-resolved  Discharge Condition: Good  Diet recommendation: Regular  Filed Weights   04/14/13 2355  Weight: 67.2 kg (148 lb 2.4 oz)    History of present illness:  Raymond [name] is a 57 y.o. male while at work was found to have sudden loss of consciousness with jerking movements of both upper and lower extremities as witnessed by patient's colleagues. From the report was received patient had these episodes lasting for less than a minute after which patient became confused. Patient on my exam states that he does not recall the incident. In the ER patient had gone to the bathroom and few minutes later he was found confused lying on the floor. Presently patient is alert awake and oriented to time place and person. His CT head was negative for anything acute. Patient denies any headache focal deficits visual symptoms. Patient will be admitted for possible seizures. Reviewing patient's chart patient did have previous seizure probably from alcohol withdrawal in 2008. Patient states he drinks alcohol only once or twice a week. Last drink was yesterday.   Hospital Course:  1. Recurrent Seizures - Most likely From alcohol withdrawal - MRI, EEG wnl . Neurology team did not recommend AEDs . Patient was observed for 48 hours prior to Discharge after his alcohol level was zero, and off of benzodiazepines for 24 hours and he did not have any further seizures. The patient was told not to operate any heavy machinery, not to drive and not to work on an elevated surface. He was also told to abstain from drinking alcohol. He  will followup in the [facility].  2. Hyponatremia - from dehydration - resolved with iv fluids  3. Hypokalemia - secondary to etoh and heat - repleted and Result 4. Hypophosphatemia - etoh related  repleted  5. Alcohol withdrawal syndrome - c/w thiamine and folate and ativan prn . Resolve fully prior to discharge of the patient. The patient was off of Ativan for 24 hours prior to Discharge. The patient was offered alcohol rehab  and in an inpatient psychiatric facility and he refused 6. Rhabdomyolysis - received iv fluids and renal function Was monitored closely without any signs of acute renal failure identified. Patient displays no muscle weakness prior to discharge. Patient was seen by the physical therapist prior to discharge and he was found to be able to ambulate without assistance.       Procedures:  EEG  Consultations:  Neurology  Discharge Exam: Filed Vitals:   04/17/13 0600 04/17/13 1844 04/17/13 2109 04/18/13 0626  BP: 149/90 134/86 138/89 133/78  Pulse: 105 102 94 72  Temp: 98.3 F (36.8 C) 98.7 F (37.1 C) 98.3 F (36.8 C) 98.5 F (36.9 C)  TempSrc: Oral Oral Oral Oral  Resp: 18 18 18 18   Height:      Weight:      SpO2: 99% 100% 100% 99%    General: Alert and oriented x3 Cardiovascular: Regular rate and rhythm Respiratory: Clear to auscultation bilaterally  Discharge Instructions  Discharge Orders   Future Orders Complete By Expires     Diet general  As directed  Increase activity slowly  As directed         Medication List    TAKE these medications       thiamine 100 MG tablet  Take 1 tablet (100 mg total) by mouth daily.       No Known Allergies  Followup at the community clinic  The results of significant diagnostics from this hospitalization (including imaging, microbiology, ancillary and laboratory) are listed below for reference.    Significant Diagnostic Studies: Ct Head Wo Contrast  04/14/2013   *RADIOLOGY  REPORT*  Clinical Data: Seizure activity.  Witnessed whole body seizure.  CT HEAD WITHOUT CONTRAST  Technique:  Contiguous axial images were obtained from the base of the skull through the vertex without contrast.  Comparison: None.  Findings: Mild motion artifact is present on the examination.  No gross mass lesion, mass effect, midline shift, hydrocephalus, or hemorrhage.  The calvarium appears intact without grossly displaced skull fractures.  No acute infarct is identified.  Motion artifact is because of the patient with shaking during examination.  There is atrophy which appears slightly age advanced. Old right medial orbital wall blowout fracture.  Mild maxillary sinus disease.  IMPRESSION:  Nonspecific atrophy without gross acute intracranial abnormality.   Original Report Authenticated By: Andreas Newport, M.D.   [name] Wo Contrast  04/16/2013   *RADIOLOGY REPORT*  Clinical Data: 57 year old male with possible seizure, possible alcohol withdrawal.  Sedated, but agitated.  MRI HEAD WITHOUT AND WITH CONTRAST  Technique:  Multiplanar, multiecho pulse sequences of the brain and surrounding structures were obtained according to standard protocol without and with intravenous contrast  Contrast: 15mL MULTIHANCE GADOBENATE DIMEGLUMINE 529 MG/ML IV SOLN  Comparison: Head CTs 04/14/2013 and earlier.  Findings: Age advanced cerebral volume loss, appears generalized but possibly affecting the supratentorial brain to a greater extent than the posterior fossa.  No restricted diffusion to suggest acute infarction.  No midline shift, mass effect, evidence of mass lesion, ventriculomegaly, extra-axial collection or acute intracranial hemorrhage. Cervicomedullary junction and pituitary are within normal limits. Major intracranial vascular flow voids are preserved.  Wrap and motion artifact on postcontrast images. No abnormal enhancement identified.  No focal T2 or FLAIR signal abnormality in the brain. Negative  visualized cervical spine.  Visualized bone marrow signal is within normal limits.  Visualized orbit soft tissues are within normal limits.  Small fluid level right maxillary sinus.  Other Visualized paranasal sinuses and mastoids are clear.  Negative scalp soft tissues.  IMPRESSION: 1. No acute intracranial abnormality. 2.  Nonspecific age advanced generalized cerebral volume loss. 3.  Mild inflammatory changes right maxillary sinus.   Original Report Authenticated By: Erskine Speed, M.D.    Microbiology: Recent Results (from the past 240 hour(s))  CLOSTRIDIUM DIFFICILE BY PCR     Status: None   Collection Time    04/15/13  5:19 AM      Result Value Range Status   C difficile by pcr NEGATIVE  NEGATIVE Final  STOOL CULTURE     Status: None   Collection Time    04/15/13  5:19 AM      Result Value Range Status   Specimen Description STOOL   Final   Special Requests Normal   Final   Culture NO SUSPICIOUS COLONIES, CONTINUING TO HOLD   Final   Report Status PENDING   Incomplete     Labs: Basic Metabolic Panel:  Recent Labs Lab 04/14/13 1910 04/15/13 0615 04/16/13 0610 04/17/13 0425  NA  133* 134* 137 135  K 3.9 3.2* 3.2* 3.9  CL 91* 97 99 101  CO2 13* 25 24 24   GLUCOSE 103* 89 87 98  BUN 9 4* 3* 5*  CREATININE 0.90 0.67 0.54 0.49*  CALCIUM 9.5 9.0 9.3 9.4  MG  --  1.8 1.8  --   PHOS  --  1.3* 2.1*  --    Liver Function Tests:  Recent Labs Lab 04/14/13 1910 04/15/13 0615  AST 135* 170*  ALT 49 49  ALKPHOS 50 45  BILITOT 0.6 1.2  PROT 9.1* 8.0  ALBUMIN 4.6 4.0   No results found for this basename: LIPASE, AMYLASE,  in the last 168 hours No results found for this basename: AMMONIA,  in the last 168 hours CBC:  Recent Labs Lab 04/14/13 1910 04/15/13 0615  WBC 9.6 8.2  NEUTROABS 8.0* 6.5  HGB 13.6 12.4*  HCT 38.5* 35.6*  MCV 92.3 90.4  PLT 119* 111*   Cardiac Enzymes:  Recent Labs Lab 04/14/13 2055 04/14/13 2249 04/15/13 0615 04/17/13 0425  CKTOTAL  2196*  --  4353* 2894*  TROPONINI  --  <0.30  --   --    BNP: BNP (last 3 results) No results found for this basename: PROBNP,  in the last 8760 hours CBG:  Recent Labs Lab 04/15/13 0620  GLUCAP 91       Signed:  Davion Meara  [facility] 04/18/2013, 10:58 AM

## 2013-04-18 NOTE — Clinical Social Work Note (Signed)
Clinical Social Work   CSW received consult for transportation needs and resources for substance abuse treatment. CSW met with pt and provided bus pass and resources. CSW updated RN. CSW is signing off at this time, as no further needs at this time. Please reconsult if a need arises prior to discharge.   Dede Query, MSW,LCSW Clinical Social Work Weekend Coverage

## 2013-06-29 ENCOUNTER — Emergency Department (HOSPITAL_COMMUNITY)
Admission: EM | Admit: 2013-06-29 | Discharge: 2013-06-29 | Disposition: A | Discharge status: Home / Self Care | Payer: Self-pay | Attending: Emergency Medicine | Admitting: Emergency Medicine

## 2013-06-29 ENCOUNTER — Emergency Department (HOSPITAL_COMMUNITY): Payer: Self-pay

## 2013-06-29 ENCOUNTER — Encounter (HOSPITAL_COMMUNITY): Payer: Self-pay | Admitting: Emergency Medicine

## 2013-06-29 DIAGNOSIS — E872 Acidosis, unspecified: Secondary | ICD-10-CM | POA: Insufficient documentation

## 2013-06-29 DIAGNOSIS — Z8781 Personal history of (healed) traumatic fracture: Secondary | ICD-10-CM | POA: Insufficient documentation

## 2013-06-29 DIAGNOSIS — F10939 Alcohol use, unspecified with withdrawal, unspecified: Secondary | ICD-10-CM | POA: Insufficient documentation

## 2013-06-29 DIAGNOSIS — Z79899 Other long term (current) drug therapy: Secondary | ICD-10-CM | POA: Insufficient documentation

## 2013-06-29 DIAGNOSIS — Z8669 Personal history of other diseases of the nervous system and sense organs: Secondary | ICD-10-CM | POA: Insufficient documentation

## 2013-06-29 DIAGNOSIS — R569 Unspecified convulsions: Secondary | ICD-10-CM | POA: Insufficient documentation

## 2013-06-29 DIAGNOSIS — M6282 Rhabdomyolysis: Secondary | ICD-10-CM | POA: Insufficient documentation

## 2013-06-29 DIAGNOSIS — F10239 Alcohol dependence with withdrawal, unspecified: Secondary | ICD-10-CM | POA: Insufficient documentation

## 2013-06-29 LAB — CBC
HCT: 43.6 % (ref 39.0–52.0)
MCV: 92.6 fL (ref 78.0–100.0)
RBC: 4.71 MIL/uL (ref 4.22–5.81)
WBC: 9.2 10*3/uL (ref 4.0–10.5)

## 2013-06-29 LAB — COMPREHENSIVE METABOLIC PANEL
AST: 34 U/L (ref 0–37)
Alkaline Phosphatase: 50 U/L (ref 39–117)
BUN: 5 mg/dL — ABNORMAL LOW (ref 6–23)
CO2: 29 mEq/L (ref 19–32)
Chloride: 99 mEq/L (ref 96–112)
Creatinine, Ser: 0.67 mg/dL (ref 0.50–1.35)
GFR calc non Af Amer: >90 mL/min (ref >90)
Potassium: 4.6 mEq/L (ref 3.5–5.1)
Total Bilirubin: 0.5 mg/dL (ref 0.3–1.2)

## 2013-06-29 LAB — CK TOTAL AND CKMB (NOT AT ARMC)
CK, MB: 2.8 ng/mL (ref 0.3–4.0)
Relative Index: 1.3 (ref 0.0–2.5)
Total CK: 223 U/L (ref 7–232)

## 2013-06-29 LAB — URINALYSIS, ROUTINE W REFLEX MICROSCOPIC
Hgb urine dipstick: NEGATIVE
Ketones, ur: NEGATIVE mg/dL
Protein, ur: NEGATIVE mg/dL
Urobilinogen, UA: 1 mg/dL (ref 0.0–1.0)

## 2013-06-29 LAB — RAPID URINE DRUG SCREEN, HOSP PERFORMED
Amphetamines: NOT DETECTED
Opiates: NOT DETECTED
Tetrahydrocannabinol: NOT DETECTED

## 2013-06-29 NOTE — ED Provider Notes (Signed)
Medical screening examination/treatment/procedure(s) were performed by non-physician practitioner and as supervising physician I was immediately available for consultation/collaboration.  Arnita Koons L Rupert Azzara, MD 06/29/13 1633 

## 2013-06-29 NOTE — ED Notes (Addendum)
Per ems, on arrival, pt had seizure in right leg, was staring off to the left. Pt then had grand mal seizure. Per girlfriend, pt had a seizure once before at work. Per girlfriend, she came home at noon and found him sitting on the edge of the bed and was alert but unresponsive to any questions. When asking the pt questions, all of his answers are "nothing". Pt appears post ictal.

## 2013-06-29 NOTE — ED Notes (Addendum)
Pt now able to say name, state where he is, the date, and do serial additions. Appears to be slow in answering questions but can respond and answer them correctly. Alert and oriented x4

## 2013-06-29 NOTE — ED Provider Notes (Signed)
CSN: [id number]     Arrival date & time 06/29/13  1255 History     First MD Initiated Contact with Patient 06/29/13 1305     Chief Complaint  Patient presents with  . Seizures   (Consider location/radiation/quality/duration/timing/severity/associated sxs/prior Treatment) HPI 57 year old male who was admitted here 04/19/2013 for alcohol withdrawal seizure, rhabdomyolysis and metabolic acidosis presents the emergency department with seizure.  He is attended by a friend he states that he does not abuse alcohol or drink daily.  She also states that he told her he had a seizure before.  She is unsure he has never had any head injuries.  She also denies that he's had any symptoms of infection has been complaining of any symptoms.  The patient is post ictal and unable to give history or answer questions. Chart review shows that he has a past history of orbital blowout fracture suggesting possible head injury. Patient's friend states that today he was sitting in bed and had his hands up he started moving them and looking all around the room was "strange movements."  He then began having a generalized tonic-clonic seizure which lasted approximately 5 minutes.  He was post ictal afterwards.  Patient did arrive via EMS and had a second focal seizure.  Past Medical History  Diagnosis Date  . Seizures    Past Surgical History  Procedure Laterality Date  . Tonsillectomy     Family History  Problem Relation Age of Onset  . Other Neg Hx    History  Substance Use Topics  . Smoking status: Never Smoker   . Smokeless tobacco: Not on file  . Alcohol Use: Yes     Comment: 2 beers/day    Review of Systems Unable to review systems is patient is postictal.  Allergies  Review of patient's allergies indicates no known allergies.  Home Medications   Current Outpatient Rx  Name  Route  Sig  Dispense  Refill  . thiamine 100 MG tablet   Oral   Take 1 tablet (100 mg total) by mouth daily.   90  tablet   0    BP 159/92  Pulse 76  Temp(Src) 98.7 F (37.1 C) (Oral)  SpO2 98% Physical Exam Physical Exam  Nursing note and vitals reviewed. Constitutional: Nondistressed male HENT:  Head: Normocephalic and atraumatic.  Eyes: Conjunctivae normal are normal. No scleral icterus.  Neck: Normal range of motion. Neck supple.  Mouth: no signs of trauma to the tongue Cardiovascular: Normal rate, regular rhythm and normal heart sounds.   Pulmonary/Chest: Effort normal and breath sounds normal. No respiratory distress.  Abdominal: Soft. There is no tenderness.  Musculoskeletal: He exhibits no edema.  Neurological: He is alert.  the patient is disoriented.  I asked his name and he states "may July September August June January." Skin: Skin is warm and dry. He is not diaphoretic.  Psychiatric: His behavior is normal.    ED Course   Procedures (including critical care time)  Labs Reviewed  COMPREHENSIVE METABOLIC PANEL - Abnormal; Notable for the following:    Glucose, Bld 100 (*)    BUN 5 (*)    All other components within normal limits  GLUCOSE, CAPILLARY - Abnormal; Notable for the following:    Glucose-Capillary 102 (*)    All other components within normal limits  CBC  URINALYSIS, ROUTINE W REFLEX MICROSCOPIC  URINE RAPID DRUG SCREEN (HOSP PERFORMED)  CK TOTAL AND CKMB   No results found. No diagnosis found.  MDM  1:24 PM Filed Vitals:   06/29/13 1311  BP: 159/92  Pulse: 76  Temp: 98.7 F (37.1 C)   Patient with history of alcohol withdrawal seizures recently admitted for the scene back in June of 2014 CT and MRI results are listed below and show advanced cortical atrophy for age without other acute abnormality.  Ct Head Wo Contrast  04/14/2013 *RADIOLOGY REPORT* Clinical Data: Seizure activity. Witnessed whole body seizure. CT HEAD WITHOUT CONTRAST . IMPRESSION: Nonspecific atrophy without gross acute intracranial abnormality. Original Report Authenticated By:  Andreas Newport, M.D.  [name] Wo Contrast   04/16/2013 *RADIOLOGY REPORT* Clinical Data: 57 year old male with possible seizure, possible alcohol withdrawal. Sedated, but agitated. MRI HEAD WITHOUT AND WITH CONTRAST IMPRESSION: 1. No acute intracranial abnormality. 2. Nonspecific age advanced generalized cerebral volume loss. 3. Mild inflammatory changes right maxillary sinus. Original Report Authenticated By: Erskine Speed, M.D.    4:05 PM Filed Vitals:   06/29/13 1430 06/29/13 1500 06/29/13 1530 06/29/13 1550  BP: 130/77 151/86 151/98 151/98  Pulse: 76   69  Temp:    98.3 F (36.8 C)  TempSrc:    Oral  Resp: 16 15 18 18   SpO2: 96%   98%  Patient here s/p seizure. He is alert and oriented x4 at this point.  Patient states he was sitting in bed this morning when he started to feel "funny."  He states that his seizure then came on and then he lost consciousness.  He does not remember anything until he got to the emergency department.  He states he remembers them taking blood.  He denies any symptoms of fever, chest pain, shortness of breath, abdominal pain, nausea vomiting, diarrhea, urinary symptoms or any other signs of infection Patient states that he drinks approximately one 40 ounce a day and his last drink was last Thursday.  He denies a history of traumatic brain injury however the CT scan he did on 6 for a mention history of orbital blowout fracture on the left.  Patient denies that he had a fracture to the face or history of brain injury. He is afebrile, no tachycardia. I do not suspect signs of acute alcohol withdrawal or DTs. i have discussed the labs, history and work up with Dr. Effie Shy who fells that the patient may be discharged with warnings to abstain from alcohol.  CT negative for acute abnormality.  I personally reviewed PT using our PACs system Labs show slightly elevated glucose and no other signs of infection. The patient appears reasonably screened and/or stabilized for  discharge and I doubt any other medical condition or other [facility] requiring further screening, evaluation, or treatment in the ED at this time prior to discharge.    Arthor Captain, PA-C 06/29/13 [phone]

## 2013-06-29 NOTE — ED Notes (Signed)
Witnessed seizure by family. Pt post ictal upon EMS arrival, had a focal seizure for EMS - staring, twitching arm & leg. Has had one seizure in past.

## 2013-06-29 NOTE — ED Notes (Signed)
Notified RN of CBG 102 

## 2015-04-12 ENCOUNTER — Emergency Department (HOSPITAL_COMMUNITY)
Admission: EM | Admit: 2015-04-12 | Discharge: 2015-04-12 | Disposition: A | Discharge status: Home / Self Care | Payer: Self-pay | Attending: Emergency Medicine | Admitting: Emergency Medicine

## 2015-04-12 ENCOUNTER — Emergency Department (HOSPITAL_COMMUNITY): Payer: Self-pay

## 2015-04-12 ENCOUNTER — Encounter (HOSPITAL_COMMUNITY): Payer: Self-pay | Admitting: Emergency Medicine

## 2015-04-12 DIAGNOSIS — Y998 Other external cause status: Secondary | ICD-10-CM | POA: Insufficient documentation

## 2015-04-12 DIAGNOSIS — S20212A Contusion of left front wall of thorax, initial encounter: Secondary | ICD-10-CM | POA: Insufficient documentation

## 2015-04-12 DIAGNOSIS — W1839XA Other fall on same level, initial encounter: Secondary | ICD-10-CM | POA: Insufficient documentation

## 2015-04-12 DIAGNOSIS — Y9389 Activity, other specified: Secondary | ICD-10-CM | POA: Insufficient documentation

## 2015-04-12 DIAGNOSIS — Y9289 Other specified places as the place of occurrence of the external cause: Secondary | ICD-10-CM | POA: Insufficient documentation

## 2015-04-12 MED ORDER — IBUPROFEN 800 MG PO TABS: 800.0000 mg | ORAL_TABLET | Freq: Three times a day (TID) | ORAL | Status: DC

## 2015-04-12 MED ORDER — BENZONATATE 100 MG PO CAPS: 100.0000 mg | ORAL_CAPSULE | Freq: Three times a day (TID) | ORAL | Status: DC

## 2015-04-12 NOTE — ED Provider Notes (Signed)
CSN: [id number]     Arrival date & time 04/12/15  1117 History  This chart was scribed for Trisha MangleKaren Sophia, PA-C, working with Gerhard Munchobert Lockwood, MD by Elon SpannerGarrett Cook, ED Scribe. This patient was seen in room TR09C/TR09C and the patient's care was started at 12:15 PM.    Chief Complaint  Patient presents with  . Rib Injury   The history is provided by the patient. No language interpreter was used.   HPI Comments: Raymond [name] is a 59 y.o. male who presents to the Emergency Department complaining of constant left lateral CP onset 2 weeks ago after a fall.  The complaint is worsened by cough.  He denies history of chronic medical conditions including DM, HTN.  He does not take any medications regularly.  He does not smoke.  He denies SOB, fatigue.    Past Medical History  Diagnosis Date  . Seizures    Past Surgical History  Procedure Laterality Date  . Tonsillectomy     Family History  Problem Relation Age of Onset  . Other Neg Hx    History  Substance Use Topics  . Smoking status: Never Smoker   . Smokeless tobacco: Not on file  . Alcohol Use: Yes     Comment: 2 beers/day    Review of Systems  Respiratory: Positive for cough.   Cardiovascular: Positive for chest pain.  All other systems reviewed and are negative.     Allergies  Review of patient's allergies indicates no known allergies.  Home Medications   Prior to Admission medications   Not on File   BP 115/71 mmHg  Pulse 96  Temp(Src) 98.2 F (36.8 C) (Oral)  Resp 20  SpO2 99% Physical Exam  Constitutional: He is oriented to person, place, and time. He appears well-developed and well-nourished. No distress.  HENT:  Head: Normocephalic and atraumatic.  Eyes: Conjunctivae and EOM are normal.  Neck: Neck supple. No tracheal deviation present.  Cardiovascular: Normal rate.   Pulmonary/Chest: Effort normal. No respiratory distress.  Musculoskeletal: Normal range of motion.  Neurological: He is alert and oriented to  person, place, and time.  Skin: Skin is warm and dry.  Psychiatric: He has a normal mood and affect. His behavior is normal.  Nursing note and vitals reviewed.   ED Course  Procedures (including critical care time)  DIAGNOSTIC STUDIES: Oxygen Saturation is 99% on RA, normal by my interpretation.    COORDINATION OF CARE:  12:20 PM Discussed treatment plan with patient at bedside.  Patient acknowledges and agrees with plan.    Labs Review Labs Reviewed - No data to display  Imaging Review Dg Ribs Unilateral W/chest Left  04/12/2015   CLINICAL DATA:  Pain following trauma  EXAM: LEFT RIBS AND CHEST - 3+ VIEW  COMPARISON:  None.  FINDINGS: Frontal chest as well as cone-down lateral and oblique views obtained. Lungs are clear. Heart size and pulmonary vascularity are normal. No adenopathy. There are multiple old healed rib fractures bilaterally. No pneumothorax or effusion. There is no demonstrable acute fracture.  IMPRESSION: Chronic healed rib fractures bilaterally. No acute fracture evident. Lungs clear.   Electronically Signed   By: Bretta BangWilliam  Woodruff III M.D.   On: 04/12/2015 12:30     EKG Interpretation None      MDM old fractures no new fractures.   No pneumonia.   Pt given rx for tessalon perles and ibuprofen.    Final diagnoses:  None     I personally performed the  services in this documentation, which was scribed in my presence.  The recorded information has been reviewed and considered.   Barnet Pall.   Lonia Skinner [place], PA-C 04/12/15 1243  Gerhard Munch, MD 04/12/15 [phone]

## 2015-04-12 NOTE — ED Notes (Signed)
PA notified pt is rib pain.

## 2015-04-12 NOTE — Discharge Instructions (Signed)

## 2015-04-12 NOTE — ED Notes (Signed)
Pt fell 2 weeks ago onto curb and chest underarm rib L side hurting since. Thinks he may have fx ribs. Denies SOB but coughing and pain associated with cough. Pain with movement. Nonsmoker.

## 2015-08-22 ENCOUNTER — Encounter (HOSPITAL_COMMUNITY): Payer: Self-pay | Admitting: *Deleted

## 2015-08-22 ENCOUNTER — Emergency Department (HOSPITAL_COMMUNITY)
Admission: EM | Admit: 2015-08-22 | Discharge: 2015-08-22 | Disposition: A | Discharge status: Home / Self Care | Payer: Self-pay | Attending: Emergency Medicine | Admitting: Emergency Medicine

## 2015-08-22 DIAGNOSIS — Y9289 Other specified places as the place of occurrence of the external cause: Secondary | ICD-10-CM | POA: Insufficient documentation

## 2015-08-22 DIAGNOSIS — Z791 Long term (current) use of non-steroidal anti-inflammatories (NSAID): Secondary | ICD-10-CM | POA: Insufficient documentation

## 2015-08-22 DIAGNOSIS — Y998 Other external cause status: Secondary | ICD-10-CM | POA: Insufficient documentation

## 2015-08-22 DIAGNOSIS — Y9389 Activity, other specified: Secondary | ICD-10-CM | POA: Insufficient documentation

## 2015-08-22 DIAGNOSIS — S0101XA Laceration without foreign body of scalp, initial encounter: Secondary | ICD-10-CM | POA: Insufficient documentation

## 2015-08-22 NOTE — ED Notes (Addendum)
Reviewed discharge instructions with patient. Patient verbalized understanding r/e laceration care.  Gave patient bus pass.
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
## 2015-08-22 NOTE — Discharge Instructions (Signed)
Nonsutured Laceration Care °A laceration is a cut that goes through all layers of the skin and extends into the tissue that is right under the skin. This type of cut is usually stitched up (sutured) or closed with tape (adhesive strips) or skin glue shortly after the injury happens. °However, if the wound is dirty or if several hours pass before medical treatment is provided, it is likely that germs (bacteria) will enter the wound. Closing a laceration after bacteria have entered it increases the risk of infection. In these cases, your health care provider may leave the laceration open (nonsutured) and cover it with a bandage. This type of treatment helps prevent infection and allows the wound to heal from the deepest layer of tissue damage up to the surface. °An open fracture is a type of injury that may involve nonsutured lacerations. An open fracture is a break in a bone that happens along with one or more lacerations through the skin that is near the fracture site. °HOW TO CARE FOR YOUR NONSUTURED LACERATION °· Take or apply over-the-counter and prescription medicines only as told by your health care provider. °· If you were prescribed an antibiotic medicine, take or apply it as told by your health care provider. Do not stop using the antibiotic even if your condition improves. °· Clean the wound one time each day or as told by your health care provider. °¨ Wash the wound with mild soap and water. °¨ Rinse the wound with water to remove all soap. °¨ Pat your wound dry with a clean towel. Do not rub the wound. °· Do not inject anything into the wound unless your health care provider told you to. °· Change any bandages (dressings) as told by your health care provider. This includes changing the dressing if it gets wet, dirty, or starts to smell bad. °· Keep the dressing dry until your health care provider says it can be removed. Do not take baths, swim, or do anything that puts your wound underwater until your  health care provider approves. °· Raise (elevate) the injured area above the level of your heart while you are sitting or lying down, if possible. °· Do not scratch or pick at the wound. °· Check your wound every day for signs of infection. Watch for: °¨ Redness, swelling, or pain. °¨ Fluid, blood, or pus. °· Keep all follow-up visits as told by your health care provider. This is important. °SEEK MEDICAL CARE IF: °· You received a tetanus and shot and you have swelling, severe pain, redness, or bleeding at the injection site.   °· You have a fever. °· Your pain is not controlled with medicine. °· You have increased redness, swelling, or pain at the site of your wound. °· You have fluid, blood, or pus coming from your wound. °· You notice a bad smell coming from your wound or your dressing. °· You notice something coming out of the wound, such as wood or glass. °· You notice a change in the color of your skin near your wound. °· You develop a new rash. °· You need to change the dressing frequently due to fluid, blood, or pus draining from the wound. °· You develop numbness around your wound. °SEEK IMMEDIATE MEDICAL CARE IF: °· Your pain suddenly increases and is severe. °· You develop severe swelling around the wound. °· The wound is on your hand or foot and you cannot properly move a finger or toe. °· The wound is on your hand or   foot and you notice that your fingers or toes look pale or bluish. °· You have a red streak going away from your wound. °  °This information is not intended to replace advice given to you by your health care provider. Make sure you discuss any questions you have with your health care provider. °  °Document Released: 09/25/2006 Document Revised: 03/14/2015 Document Reviewed: 10/24/2014 °Elsevier Interactive Patient Education ©2016 Elsevier Inc. ° °
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
## 2015-11-27 ENCOUNTER — Emergency Department (HOSPITAL_COMMUNITY)
Admission: EM | Admit: 2015-11-27 | Discharge: 2015-11-27 | Disposition: A | Discharge status: Home / Self Care | Payer: Medicaid Other | Attending: Emergency Medicine | Admitting: Emergency Medicine

## 2015-11-27 ENCOUNTER — Encounter (HOSPITAL_COMMUNITY): Payer: Self-pay | Admitting: Emergency Medicine

## 2015-11-27 ENCOUNTER — Emergency Department (HOSPITAL_COMMUNITY): Payer: Medicaid Other

## 2015-11-27 DIAGNOSIS — R569 Unspecified convulsions: Secondary | ICD-10-CM | POA: Insufficient documentation

## 2015-11-27 DIAGNOSIS — Z79899 Other long term (current) drug therapy: Secondary | ICD-10-CM | POA: Insufficient documentation

## 2015-11-27 DIAGNOSIS — Z791 Long term (current) use of non-steroidal anti-inflammatories (NSAID): Secondary | ICD-10-CM | POA: Insufficient documentation

## 2015-11-27 DIAGNOSIS — R4182 Altered mental status, unspecified: Secondary | ICD-10-CM

## 2015-11-27 DIAGNOSIS — R55 Syncope and collapse: Secondary | ICD-10-CM | POA: Insufficient documentation

## 2015-11-27 LAB — COMPREHENSIVE METABOLIC PANEL
ALK PHOS: 51 U/L (ref 38–126)
ALT: 39 U/L (ref 17–63)
AST: 60 U/L — AB (ref 15–41)
Albumin: 4.6 g/dL (ref 3.5–5.0)
Anion gap: 9 (ref 5–15)
BILIRUBIN TOTAL: 0.4 mg/dL (ref 0.3–1.2)
CALCIUM: 10 mg/dL (ref 8.9–10.3)
CO2: 26 mmol/L (ref 22–32)
CREATININE: 0.83 mg/dL (ref 0.61–1.24)
Chloride: 100 mmol/L — ABNORMAL LOW (ref 101–111)
GFR calc Af Amer: >60 mL/min (ref >60)
Glucose, Bld: 89 mg/dL (ref 65–99)
Potassium: 4.4 mmol/L (ref 3.5–5.1)
Sodium: 135 mmol/L (ref 135–145)
TOTAL PROTEIN: 9 g/dL — AB (ref 6.5–8.1)

## 2015-11-27 LAB — URINALYSIS, ROUTINE W REFLEX MICROSCOPIC
BILIRUBIN URINE: NEGATIVE
GLUCOSE, UA: NEGATIVE mg/dL
HGB URINE DIPSTICK: NEGATIVE
KETONES UR: NEGATIVE mg/dL
Leukocytes, UA: NEGATIVE
Nitrite: NEGATIVE
PROTEIN: NEGATIVE mg/dL
Specific Gravity, Urine: 1.012 (ref 1.005–1.030)
pH: 7.5 (ref 5.0–8.0)

## 2015-11-27 LAB — RAPID URINE DRUG SCREEN, HOSP PERFORMED
AMPHETAMINES: NOT DETECTED
Barbiturates: NOT DETECTED
Benzodiazepines: NOT DETECTED
Cocaine: NOT DETECTED
Opiates: NOT DETECTED
Tetrahydrocannabinol: NOT DETECTED

## 2015-11-27 LAB — CK: CK TOTAL: 184 U/L (ref 49–397)

## 2015-11-27 LAB — CBC
HCT: 47.9 % (ref 39.0–52.0)
Hemoglobin: 16 g/dL (ref 13.0–17.0)
MCH: 31.5 pg (ref 26.0–34.0)
MCHC: 33.4 g/dL (ref 30.0–36.0)
MCV: 94.3 fL (ref 78.0–100.0)
PLATELETS: 168 10*3/uL (ref 150–400)
RBC: 5.08 MIL/uL (ref 4.22–5.81)
RDW: 13.8 % (ref 11.5–15.5)
WBC: 7.8 10*3/uL (ref 4.0–10.5)

## 2015-11-27 LAB — CBG MONITORING, ED: Glucose-Capillary: 82 mg/dL (ref 65–99)

## 2015-11-27 LAB — ETHANOL

## 2015-11-27 LAB — AMMONIA: AMMONIA: 53 umol/L — AB (ref 9–35)

## 2015-11-27 MED ORDER — LORAZEPAM 1 MG PO TABS: 0.0000 mg | ORAL_TABLET | Freq: Two times a day (BID) | ORAL | Status: DC

## 2015-11-27 MED ORDER — LORAZEPAM 2 MG/ML IJ SOLN: 0.0000 mg | Freq: Four times a day (QID) | INTRAMUSCULAR | Status: DC

## 2015-11-27 MED ORDER — VITAMIN B-1 100 MG PO TABS
100.0000 mg | ORAL_TABLET | Freq: Every day | ORAL | Status: DC
  Administered 2015-11-27: 100 mg via ORAL
  Filled 2015-11-27: qty 1

## 2015-11-27 MED ORDER — LORAZEPAM 1 MG PO TABS
0.0000 mg | ORAL_TABLET | Freq: Four times a day (QID) | ORAL | Status: DC
Start: 2015-11-27 — End: 2015-11-28

## 2015-11-27 MED ORDER — LORAZEPAM 2 MG/ML IJ SOLN
0.0000 mg | Freq: Two times a day (BID) | INTRAMUSCULAR | Status: DC
Start: 2015-11-27 — End: 2015-11-28

## 2015-11-27 MED ORDER — ACETAMINOPHEN 325 MG PO TABS
650.0000 mg | ORAL_TABLET | Freq: Once | ORAL | Status: AC
  Administered 2015-11-27: 650 mg via ORAL
  Filled 2015-11-27: qty 2

## 2015-11-27 MED ORDER — THIAMINE HCL 100 MG/ML IJ SOLN: 100.0000 mg | Freq: Every day | INTRAMUSCULAR | Status: DC

## 2015-11-27 NOTE — ED Notes (Signed)
Pt reports drinking "about a pint" day and that "it's been a while, a few days" since his last drink.

## 2015-11-27 NOTE — ED Notes (Signed)
CBG 82 

## 2015-11-27 NOTE — Discharge Instructions (Signed)
You were seen and evaluated for the episode you had of change in mental status. The exact cause is unclear. This may have been a seizure. Follow-up with the neurologist as directed. Return for sudden return of symptoms. Also follow up outpatient to establish primary [facility] at [facility].  Possible Seizure, Adult A seizure is abnormal electrical activity in the brain. Seizures usually last from 30 seconds to 2 minutes. There are various types of seizures. Before a seizure, you may have a warning sensation (aura) that a seizure is about to occur. An aura may include the following symptoms:   Fear or anxiety.  Nausea.  Feeling like the room is spinning (vertigo).  Vision changes, such as seeing flashing lights or spots. Common symptoms during a seizure include:  A change in attention or behavior (altered mental status).  Convulsions with rhythmic jerking movements.  Drooling.  Rapid eye movements.  Grunting.  Loss of bladder and bowel control.  Bitter taste in the mouth.  Tongue biting. After a seizure, you may feel confused and sleepy. You may also have an injury resulting from convulsions during the seizure. HOME CARE INSTRUCTIONS   If you are given medicines, take them exactly as prescribed by your health care provider.  Keep all follow-up appointments as directed by your health care provider.  Do not swim or drive or engage in risky activity during which a seizure could cause further injury to you or others until your health care provider says it is OK.  Get adequate rest.  Teach friends and family what to do if you have a seizure. They should:  Lay you on the ground to prevent a fall.  Put a cushion under your head.  Loosen any tight clothing around your neck.  Turn you on your side. If vomiting occurs, this helps keep your airway clear.  Stay with you until you recover.  Know whether or not you need emergency care. SEEK IMMEDIATE MEDICAL CARE IF:  The  seizure lasts longer than 5 minutes.  The seizure is severe or you do not wake up immediately after the seizure.  You have an altered mental status after the seizure.  You are having more frequent or worsening seizures. Someone should drive you to the emergency department or call local emergency services (911 in [place] MAKE SURE YOU:  Understand these instructions.  Will watch your condition.  Will get help right away if you are not doing well or get worse.   This information is not intended to replace advice given to you by your health care provider. Make sure you discuss any questions you have with your health care provider.   Document Released: 10/25/2000 Document Revised: 11/18/2014 Document Reviewed: 06/09/2013 Elsevier Interactive Patient Education [employer]2016 Elsevier Inc.
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
## 2015-11-27 NOTE — ED Notes (Signed)
Patient transported to X-ray 

## 2015-11-27 NOTE — ED Notes (Signed)
Pt in CT.

## 2015-11-27 NOTE — ED Provider Notes (Signed)
CSN: [id number]     Arrival date & time 11/27/15  1445 History   First MD Initiated Contact with Patient 11/27/15 1508     Chief Complaint  Patient presents with  . Altered Mental Status     (Consider location/radiation/quality/duration/timing/severity/associated sxs/prior Treatment) HPI Comments: 60 year old male with history of seizures possibly secondary to alcohol withdrawal, normal EEG in 2014 presents for altered mental status. The patient reportedly went to breakfast in his normal health this morning. He then went to his room to watch TV. His friend she was at that home with him then found him unresponsive and foaming at the mouth. The patient denies any knowledge of these events. He reports a very mild headache the front of his head. He has not able to give any history at this time. He says that he does not really drink alcohol although he told nursing he usually drinks about a pint a day and that it's been a few days since his last drink. Patient denies nausea, vomiting, diarrhea, chest pain, palpitations, shortness of breath.  Patient is a 60 y.o. male presenting with altered mental status.  Altered Mental Status Associated symptoms: seizures   Associated symptoms: no abdominal pain, no headaches, no nausea, no palpitations, no rash, no vomiting and no weakness     Past Medical History  Diagnosis Date  . Seizures [facility])    Past Surgical History  Procedure Laterality Date  . Tonsillectomy     Family History  Problem Relation Age of Onset  . Other Neg Hx    Social History  Substance Use Topics  . Smoking status: Never Smoker   . Smokeless tobacco: Never Used  . Alcohol Use: Yes     Comment: pt reports drinking a pint a day    Review of Systems  Constitutional: Negative for diaphoresis, appetite change and fatigue.  HENT: Negative for congestion, postnasal drip, rhinorrhea and sneezing.   Eyes: Negative for pain and redness.  Respiratory: Negative for cough, chest  tightness, shortness of breath and wheezing.   Cardiovascular: Negative for chest pain and palpitations.  Gastrointestinal: Negative for nausea, vomiting, abdominal pain, diarrhea and constipation.  Genitourinary: Negative for dysuria, urgency, frequency and hematuria.  Musculoskeletal: Negative for myalgias, back pain and neck pain.  Skin: Negative for rash and wound.  Neurological: Positive for seizures and syncope. Negative for dizziness, weakness and headaches.      Allergies  Review of patient's allergies indicates no known allergies.  Home Medications   Prior to Admission medications   Medication Sig Start Date End Date Taking? Authorizing Provider  benzonatate (TESSALON) 100 MG capsule Take 1 capsule (100 mg total) by mouth every 8 (eight) hours. 04/12/15   Elson AreasLeslie K Sofia, PA-C  ibuprofen (ADVIL,MOTRIN) 800 MG tablet Take 1 tablet (800 mg total) by mouth 3 (three) times daily. 04/12/15   Elson AreasLeslie K Sofia, PA-C   BP 126/79 mmHg  Pulse 76  Temp(Src) 98.2 F (36.8 C) (Oral)  Resp 12  SpO2 98% Physical Exam  Constitutional: He appears well-developed and well-nourished. No distress.  HENT:  Head: Normocephalic and atraumatic.  Right Ear: External ear normal.  Left Ear: External ear normal.  Mouth/Throat: Oropharynx is clear and moist. No oropharyngeal exudate.  Eyes: EOM are normal. Pupils are equal, round, and reactive to light.  Neck: Normal range of motion. Neck supple.  Cardiovascular: Normal rate, regular rhythm, normal heart sounds and intact distal pulses.   No murmur heard. Pulmonary/Chest: Effort normal. No respiratory distress. He  has no wheezes. He has no rales.  Abdominal: Soft. He exhibits no distension. There is no tenderness.  Musculoskeletal: He exhibits no edema.  Neurological: He is alert. He has normal strength. No cranial nerve deficit or sensory deficit. He exhibits normal muscle tone. Coordination normal.  Oriented to self and place but not to time.  Normal  finger to nose  Skin: Skin is warm and dry. No rash noted. He is not diaphoretic.  Vitals reviewed.   ED Course  Procedures (including critical care time) Labs Review Labs Reviewed  COMPREHENSIVE METABOLIC PANEL - Abnormal; Notable for the following:    Chloride 100 (*)    BUN <5 (*)    Total Protein 9.0 (*)    AST 60 (*)    All other components within normal limits  AMMONIA - Abnormal; Notable for the following:    Ammonia 53 (*)    All other components within normal limits  CBC  URINALYSIS, ROUTINE W REFLEX MICROSCOPIC (NOT AT [facility])  URINE RAPID DRUG SCREEN, HOSP PERFORMED  ETHANOL  CK  CBG MONITORING, ED    Imaging Review Dg Chest 2 View  11/27/2015  CLINICAL DATA:  Altered mental status EXAM: CHEST  2 VIEW COMPARISON:  04/12/2015 FINDINGS: Multiple unchanged bilateral rib fractures. Heart size and vascular pattern normal. No pleural effusion. Bony thorax otherwise intact. Heart size and vascular pattern normal.  Right lung clear. Mild atelectasis in the left lower lobe seen on both the PA and lateral view. IMPRESSION: No significant acute findings.  Mild left lower lobe atelectasis. Electronically Signed   By: Esperanza Heir M.D.   On: 11/27/2015 16:43   Ct Head Wo Contrast  11/27/2015  CLINICAL DATA:  Found in bedroom with white froth coming from mouth, unresponsive, altered mental status, history seizures, Alzheimer's, ethanol use EXAM: CT HEAD WITHOUT CONTRAST TECHNIQUE: Contiguous axial images were obtained from the base of the skull through the vertex without intravenous contrast. COMPARISON:  06/29/2013 FINDINGS: Generalized atrophy. Normal ventricular morphology. No midline shift or mass effect. Small vessel chronic ischemic changes of deep cerebral white matter. No intracranial hemorrhage, mass lesion or evidence acute infarction. No extra-axial fluid collections. Bones and sinuses unremarkable. Minimal atherosclerotic calcification at carotid siphons. IMPRESSION: Atrophy  with small vessel chronic ischemic changes of deep cerebral white matter. No acute intracranial abnormalities. Electronically Signed   By: Ulyses Southward M.D.   On: 11/27/2015 17:35   I have personally reviewed and evaluated these images and lab results as part of my medical decision-making.   EKG Interpretation   Date/Time:  Monday November 27 2015 14:54:32 EST Ventricular Rate:  85 PR Interval:  164 QRS Duration: 90 QT Interval:  380 QTC Calculation: 452 R Axis:   38 Text Interpretation:  Sinus rhythm Left atrial enlargement Minimal ST  elevation, inferior leads No significant change since last tracing  Confirmed by Lucia Harm ([id number]) on 11/27/2015 3:09:42 PM      MDM  Patient seen and evaluated in stable condition.  Patient noted to have had similar episodes in the past. He has never followed up outpatient with neurology.  Patient became more alert and talkative while in the ER. No sign of trauma on examination. CT head and chest x-ray unremarkable. Laboratory studies unremarkable. Patient without signs of alcohol withdrawal as he was not tachycardic or hypertensive.  Discussed briefly with the neurologist on-call. Patient did have a normal EEG in 2014. Neurology recommended outpatient follow-up for further management. This was discussed with patient  who said that he felt well and agreed with this plan of care. The order for close follow-up was placed in discharge orders. Patient was discharged home in stable condition with strict return precautions. Final diagnoses:  Altered mental status, unspecified altered mental status type    1. Change in mental status, concern for seizure    Leta Baptist, MD 11/28/15 [phone]

## 2017-03-31 ENCOUNTER — Emergency Department (HOSPITAL_COMMUNITY)
Admission: EM | Admit: 2017-03-31 | Discharge: 2017-04-01 | Disposition: A | Discharge status: Home / Self Care | Payer: Medicaid Other | Attending: Emergency Medicine | Admitting: Emergency Medicine

## 2017-03-31 ENCOUNTER — Encounter (HOSPITAL_COMMUNITY): Payer: Self-pay | Admitting: Emergency Medicine

## 2017-03-31 DIAGNOSIS — Y999 Unspecified external cause status: Secondary | ICD-10-CM | POA: Insufficient documentation

## 2017-03-31 DIAGNOSIS — S01112A Laceration without foreign body of left eyelid and periocular area, initial encounter: Secondary | ICD-10-CM | POA: Insufficient documentation

## 2017-03-31 DIAGNOSIS — Y939 Activity, unspecified: Secondary | ICD-10-CM | POA: Insufficient documentation

## 2017-03-31 DIAGNOSIS — Y929 Unspecified place or not applicable: Secondary | ICD-10-CM | POA: Insufficient documentation

## 2017-03-31 DIAGNOSIS — S0181XA Laceration without foreign body of other part of head, initial encounter: Secondary | ICD-10-CM
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
## 2017-04-01 NOTE — Discharge Instructions (Signed)
Return if any problems.

## 2017-04-01 NOTE — ED Notes (Signed)
Pt verbalized understanding of d/c instructions and has no further questions. Pt is stable, A&Ox4, VSS.  

## 2017-07-15 ENCOUNTER — Emergency Department (HOSPITAL_COMMUNITY): Payer: Self-pay

## 2017-07-15 ENCOUNTER — Emergency Department (HOSPITAL_COMMUNITY)
Admission: EM | Admit: 2017-07-15 | Discharge: 2017-07-16 | Disposition: A | Discharge status: Home / Self Care | Payer: Self-pay | Attending: Emergency Medicine | Admitting: Emergency Medicine

## 2017-07-15 ENCOUNTER — Encounter (HOSPITAL_COMMUNITY): Payer: Self-pay

## 2017-07-15 DIAGNOSIS — R4182 Altered mental status, unspecified: Secondary | ICD-10-CM

## 2017-07-15 DIAGNOSIS — F10920 Alcohol use, unspecified with intoxication, uncomplicated: Secondary | ICD-10-CM | POA: Insufficient documentation

## 2017-07-15 LAB — COMPREHENSIVE METABOLIC PANEL
ALBUMIN: 2.9 g/dL — AB (ref 3.5–5.0)
ALK PHOS: 34 U/L — AB (ref 38–126)
ALT: 33 U/L (ref 17–63)
AST: 75 U/L — ABNORMAL HIGH (ref 15–41)
Anion gap: 7 (ref 5–15)
BUN: 5 mg/dL — AB (ref 6–20)
CALCIUM: 7.4 mg/dL — AB (ref 8.9–10.3)
CO2: 25 mmol/L (ref 22–32)
CREATININE: 0.92 mg/dL (ref 0.61–1.24)
Chloride: 113 mmol/L — ABNORMAL HIGH (ref 101–111)
GFR calc Af Amer: >60 mL/min (ref >60)
GFR calc non Af Amer: >60 mL/min (ref >60)
GLUCOSE: 126 mg/dL — AB (ref 65–99)
Potassium: 4.3 mmol/L (ref 3.5–5.1)
SODIUM: 145 mmol/L (ref 135–145)
Total Bilirubin: 0.4 mg/dL (ref 0.3–1.2)
Total Protein: 6.4 g/dL — ABNORMAL LOW (ref 6.5–8.1)

## 2017-07-15 LAB — CBC WITH DIFFERENTIAL/PLATELET
BASOS PCT: 0 %
Basophils Absolute: 0 10*3/uL (ref 0.0–0.1)
EOS ABS: 0 10*3/uL (ref 0.0–0.7)
Eosinophils Relative: 1 %
HCT: 41.1 % (ref 39.0–52.0)
HEMOGLOBIN: 13.4 g/dL (ref 13.0–17.0)
Lymphocytes Relative: 44 %
Lymphs Abs: 2.8 10*3/uL (ref 0.7–4.0)
MCH: 30.9 pg (ref 26.0–34.0)
MCHC: 32.6 g/dL (ref 30.0–36.0)
MCV: 94.9 fL (ref 78.0–100.0)
Monocytes Absolute: 0.3 10*3/uL (ref 0.1–1.0)
Monocytes Relative: 5 %
NEUTROS ABS: 3.3 10*3/uL (ref 1.7–7.7)
NEUTROS PCT: 50 %
Platelets: 148 10*3/uL — ABNORMAL LOW (ref 150–400)
RBC: 4.33 MIL/uL (ref 4.22–5.81)
RDW: 15.7 % — AB (ref 11.5–15.5)
WBC: 6.4 10*3/uL (ref 4.0–10.5)

## 2017-07-15 LAB — URINALYSIS, ROUTINE W REFLEX MICROSCOPIC
BILIRUBIN URINE: NEGATIVE
GLUCOSE, UA: NEGATIVE mg/dL
Hgb urine dipstick: NEGATIVE
Ketones, ur: NEGATIVE mg/dL
LEUKOCYTES UA: NEGATIVE
NITRITE: NEGATIVE
Protein, ur: NEGATIVE mg/dL
SPECIFIC GRAVITY, URINE: 1.011 (ref 1.005–1.030)
pH: 5 (ref 5.0–8.0)

## 2017-07-15 LAB — I-STAT CG4 LACTIC ACID, ED
LACTIC ACID, VENOUS: 4.62 mmol/L — AB (ref 0.5–1.9)
Lactic Acid, Venous: 2.74 mmol/L (ref 0.5–1.9)

## 2017-07-15 LAB — RAPID URINE DRUG SCREEN, HOSP PERFORMED
AMPHETAMINES: NOT DETECTED
Barbiturates: NOT DETECTED
Benzodiazepines: NOT DETECTED
Cocaine: NOT DETECTED
Opiates: NOT DETECTED
TETRAHYDROCANNABINOL: NOT DETECTED

## 2017-07-15 LAB — CK: Total CK: 391 U/L (ref 49–397)

## 2017-07-15 LAB — LIPASE, BLOOD: Lipase: 24 U/L (ref 11–51)

## 2017-07-15 LAB — SALICYLATE LEVEL

## 2017-07-15 LAB — ETHANOL: Alcohol, Ethyl (B): 376 mg/dL (ref <5)

## 2017-07-15 LAB — ACETAMINOPHEN LEVEL: Acetaminophen (Tylenol), Serum: <10 ug/mL — ABNORMAL LOW (ref 10–30)

## 2017-07-15 MED ORDER — THIAMINE HCL 100 MG/ML IJ SOLN
Freq: Once | INTRAVENOUS | Status: AC
  Administered 2017-07-15: 18:00:00 via INTRAVENOUS
  Filled 2017-07-15: qty 1000

## 2017-07-15 MED ORDER — PIPERACILLIN-TAZOBACTAM 3.375 G IVPB: 3.3750 g | Freq: Three times a day (TID) | INTRAVENOUS | Status: DC

## 2017-07-15 MED ORDER — VANCOMYCIN HCL IN DEXTROSE 1-5 GM/200ML-% IV SOLN: 1000.0000 mg | Freq: Two times a day (BID) | INTRAVENOUS | Status: DC

## 2017-07-15 MED ORDER — PIPERACILLIN-TAZOBACTAM 3.375 G IVPB 30 MIN
3.3750 g | Freq: Once | INTRAVENOUS | Status: AC
  Administered 2017-07-15: 3.375 g via INTRAVENOUS
  Filled 2017-07-15: qty 50

## 2017-07-15 MED ORDER — SODIUM CHLORIDE 0.9 % IV BOLUS (SEPSIS)
1250.0000 mL | Freq: Once | INTRAVENOUS | Status: AC
  Administered 2017-07-15: 1250 mL via INTRAVENOUS

## 2017-07-15 MED ORDER — VANCOMYCIN HCL IN DEXTROSE 1-5 GM/200ML-% IV SOLN
1000.0000 mg | Freq: Once | INTRAVENOUS | Status: DC
  Filled 2017-07-15: qty 200

## 2017-07-15 MED ORDER — SODIUM CHLORIDE 0.9 % IV BOLUS (SEPSIS)
1000.0000 mL | Freq: Once | INTRAVENOUS | Status: AC
  Administered 2017-07-15: 1000 mL via INTRAVENOUS

## 2017-07-15 MED ORDER — SODIUM CHLORIDE 0.9 % IV SOLN
1500.0000 mg | Freq: Once | INTRAVENOUS | Status: AC
  Administered 2017-07-15: 1500 mg via INTRAVENOUS
  Filled 2017-07-15: qty 1500

## 2017-07-15 NOTE — ED Notes (Signed)
Pt assisted to use urinal in bed to give urine specimen.

## 2017-07-15 NOTE — ED Notes (Signed)
1 set of blood cultures of obtained by this RN, unable to collect second set at this time due to difficulty stick.
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
## 2017-07-15 NOTE — ED Notes (Signed)
Pt's BP now in the 50s-60s systolic. Dr Clarene DukeLittle notified and ordered to give pt another bolus. Pt now more lethargic but still alert. Pressure bag on bolus.

## 2017-07-15 NOTE — ED Notes (Signed)
Pt now alert and oriented to self, time and place, but cannot remember any of the event that brought him in. Pt states that he remembers he was walking to [facility] where he lives but that is all. Pt incontinent of stool. Cleaned and bed changed.

## 2017-07-15 NOTE — ED Notes (Signed)
Pt taken to Ct. 

## 2017-07-15 NOTE — ED Notes (Signed)
EMT in room, pt found to be standing up and stating that he is ready to leave. Pt sat back down in bed and encouraged to stay. Pt stated that he will stay until the dr comes back in.
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
## 2017-07-15 NOTE — ED Notes (Signed)
Lab results given to Dr. Clarene DukeLittle.

## 2017-07-16 NOTE — ED Notes (Signed)
Unable to locate pt. at ER/waiting area.

## 2017-07-16 NOTE — ED Notes (Signed)
EDP notified that  Pt. removed his monitor leads and IV line , EDP spoke  with pt. on plan of care .

## 2017-07-16 NOTE — ED Notes (Signed)
EDP notified that pt. left the ER .
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
## 2017-07-17 LAB — URINE CULTURE
CULTURE: NO GROWTH
Special Requests: NORMAL

## 2017-07-20 LAB — CULTURE, BLOOD (ROUTINE X 2)
Culture: NO GROWTH
Culture: NO GROWTH
SPECIAL REQUESTS: ADEQUATE
Special Requests: ADEQUATE

## 2017-08-20 DIAGNOSIS — F10929 Alcohol use, unspecified with intoxication, unspecified: Secondary | ICD-10-CM | POA: Insufficient documentation

## 2017-08-20 DIAGNOSIS — Z5321 Procedure and treatment not carried out due to patient leaving prior to being seen by health care provider: Secondary | ICD-10-CM | POA: Insufficient documentation
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
## 2017-08-20 NOTE — ED Notes (Signed)
Called for triage  No response from lobby 

## 2017-08-20 NOTE — ED Notes (Signed)
Called pt, no answer.

## 2017-08-21 ENCOUNTER — Emergency Department (HOSPITAL_COMMUNITY)
Admission: EM | Admit: 2017-08-21 | Discharge: 2017-08-21 | Disposition: A | Discharge status: Home / Self Care | Payer: Self-pay | Attending: Emergency Medicine | Admitting: Emergency Medicine

## 2017-08-21 ENCOUNTER — Emergency Department (HOSPITAL_COMMUNITY): Payer: Self-pay

## 2017-08-21 ENCOUNTER — Encounter (HOSPITAL_COMMUNITY): Payer: Self-pay | Admitting: Emergency Medicine

## 2017-08-21 DIAGNOSIS — Y9301 Activity, walking, marching and hiking: Secondary | ICD-10-CM | POA: Insufficient documentation

## 2017-08-21 DIAGNOSIS — Y999 Unspecified external cause status: Secondary | ICD-10-CM | POA: Insufficient documentation

## 2017-08-21 DIAGNOSIS — S0990XA Unspecified injury of head, initial encounter: Secondary | ICD-10-CM | POA: Insufficient documentation

## 2017-08-21 DIAGNOSIS — M542 Cervicalgia: Secondary | ICD-10-CM | POA: Insufficient documentation

## 2017-08-21 DIAGNOSIS — Y929 Unspecified place or not applicable: Secondary | ICD-10-CM | POA: Insufficient documentation

## 2017-08-21 MED ORDER — ACETAMINOPHEN 325 MG PO TABS
650.0000 mg | ORAL_TABLET | Freq: Once | ORAL | Status: AC
Start: 2017-08-21 — End: 2017-08-21
  Administered 2017-08-21: 650 mg via ORAL
  Filled 2017-08-21: qty 2
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
## 2017-08-21 NOTE — Discharge Instructions (Signed)
Tylenol and Motrin for any pain.  Return here as needed

## 2017-08-21 NOTE — ED Triage Notes (Signed)
Called for the third time  No response from the lobby
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
## 2017-10-14 ENCOUNTER — Other Ambulatory Visit: Payer: Self-pay

## 2017-10-14 ENCOUNTER — Emergency Department (HOSPITAL_COMMUNITY): Payer: Self-pay

## 2017-10-14 ENCOUNTER — Emergency Department (HOSPITAL_COMMUNITY)
Admission: EM | Admit: 2017-10-14 | Discharge: 2017-10-14 | Disposition: A | Discharge status: Home / Self Care | Payer: Self-pay | Attending: Emergency Medicine | Admitting: Emergency Medicine

## 2017-10-14 ENCOUNTER — Encounter (HOSPITAL_COMMUNITY): Payer: Self-pay | Admitting: Emergency Medicine

## 2017-10-14 DIAGNOSIS — S0990XA Unspecified injury of head, initial encounter: Secondary | ICD-10-CM

## 2017-10-14 DIAGNOSIS — S0181XA Laceration without foreign body of other part of head, initial encounter: Secondary | ICD-10-CM | POA: Insufficient documentation

## 2017-10-14 DIAGNOSIS — Y939 Activity, unspecified: Secondary | ICD-10-CM | POA: Insufficient documentation

## 2017-10-14 DIAGNOSIS — Y999 Unspecified external cause status: Secondary | ICD-10-CM | POA: Insufficient documentation

## 2017-10-14 DIAGNOSIS — Y929 Unspecified place or not applicable: Secondary | ICD-10-CM | POA: Insufficient documentation
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
## 2017-10-14 NOTE — Discharge Instructions (Signed)
Please return for any problem.  °

## 2017-10-14 NOTE — ED Notes (Signed)
ED Provider at bedside. 
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
## 2018-04-24 ENCOUNTER — Encounter (HOSPITAL_COMMUNITY): Payer: Self-pay

## 2018-04-24 ENCOUNTER — Other Ambulatory Visit: Payer: Self-pay

## 2018-04-24 ENCOUNTER — Inpatient Hospital Stay (HOSPITAL_COMMUNITY)
Admission: EM | Admit: 2018-04-24 | Discharge: 2018-04-27 | DRG: 872 | Disposition: A | Discharge status: Home / Self Care | Payer: Self-pay | Attending: Internal Medicine | Admitting: Internal Medicine

## 2018-04-24 ENCOUNTER — Emergency Department (HOSPITAL_COMMUNITY): Payer: Self-pay

## 2018-04-24 DIAGNOSIS — E872 Acidosis: Secondary | ICD-10-CM | POA: Diagnosis present

## 2018-04-24 DIAGNOSIS — E871 Hypo-osmolality and hyponatremia: Secondary | ICD-10-CM | POA: Diagnosis present

## 2018-04-24 DIAGNOSIS — F1020 Alcohol dependence, uncomplicated: Secondary | ICD-10-CM

## 2018-04-24 DIAGNOSIS — E878 Other disorders of electrolyte and fluid balance, not elsewhere classified: Secondary | ICD-10-CM

## 2018-04-24 DIAGNOSIS — F1010 Alcohol abuse, uncomplicated: Secondary | ICD-10-CM

## 2018-04-24 DIAGNOSIS — E876 Hypokalemia: Secondary | ICD-10-CM | POA: Diagnosis present

## 2018-04-24 DIAGNOSIS — A419 Sepsis, unspecified organism: Principal | ICD-10-CM

## 2018-04-24 DIAGNOSIS — E46 Unspecified protein-calorie malnutrition: Secondary | ICD-10-CM | POA: Diagnosis present

## 2018-04-24 DIAGNOSIS — A4189 Other specified sepsis: Secondary | ICD-10-CM

## 2018-04-24 DIAGNOSIS — F1099 Alcohol use, unspecified with unspecified alcohol-induced disorder: Secondary | ICD-10-CM

## 2018-04-24 DIAGNOSIS — B961 Klebsiella pneumoniae [K. pneumoniae] as the cause of diseases classified elsewhere: Secondary | ICD-10-CM | POA: Diagnosis present

## 2018-04-24 DIAGNOSIS — I82409 Acute embolism and thrombosis of unspecified deep veins of unspecified lower extremity: Secondary | ICD-10-CM

## 2018-04-24 DIAGNOSIS — N390 Urinary tract infection, site not specified: Secondary | ICD-10-CM | POA: Diagnosis present

## 2018-04-24 DIAGNOSIS — Z681 Body mass index (BMI) 19 or less, adult: Secondary | ICD-10-CM

## 2018-04-24 LAB — COMPREHENSIVE METABOLIC PANEL
ALBUMIN: 2.7 g/dL — AB (ref 3.5–5.0)
ALK PHOS: 56 U/L (ref 38–126)
ALT: 18 U/L (ref 17–63)
AST: 35 U/L (ref 15–41)
Anion gap: 13 (ref 5–15)
CALCIUM: 8.9 mg/dL (ref 8.9–10.3)
CO2: 26 mmol/L (ref 22–32)
CREATININE: 0.72 mg/dL (ref 0.61–1.24)
Chloride: 92 mmol/L — ABNORMAL LOW (ref 101–111)
GFR calc Af Amer: >60 mL/min (ref >60)
GLUCOSE: 105 mg/dL — AB (ref 65–99)
POTASSIUM: 4.1 mmol/L (ref 3.5–5.1)
Sodium: 131 mmol/L — ABNORMAL LOW (ref 135–145)
TOTAL PROTEIN: 7.9 g/dL (ref 6.5–8.1)
Total Bilirubin: 0.6 mg/dL (ref 0.3–1.2)

## 2018-04-24 LAB — URINALYSIS, ROUTINE W REFLEX MICROSCOPIC
BILIRUBIN URINE: NEGATIVE
GLUCOSE, UA: NEGATIVE mg/dL
Ketones, ur: NEGATIVE mg/dL
Nitrite: POSITIVE — AB
PROTEIN: NEGATIVE mg/dL
Specific Gravity, Urine: <1.005 — ABNORMAL LOW (ref 1.005–1.030)
pH: 6 (ref 5.0–8.0)

## 2018-04-24 LAB — URINALYSIS, MICROSCOPIC (REFLEX)

## 2018-04-24 LAB — CBC WITH DIFFERENTIAL/PLATELET
BASOS ABS: 0.1 10*3/uL (ref 0.0–0.1)
Basophils Relative: 1 %
EOS PCT: 0 %
Eosinophils Absolute: 0 10*3/uL (ref 0.0–0.7)
HEMATOCRIT: 38.9 % — AB (ref 39.0–52.0)
HEMOGLOBIN: 13 g/dL (ref 13.0–17.0)
LYMPHS PCT: 10 %
Lymphs Abs: 1.3 10*3/uL (ref 0.7–4.0)
MCH: 30.7 pg (ref 26.0–34.0)
MCHC: 33.4 g/dL (ref 30.0–36.0)
MCV: 91.7 fL (ref 78.0–100.0)
MONOS PCT: 10 %
Monocytes Absolute: 1.3 10*3/uL — ABNORMAL HIGH (ref 0.1–1.0)
Neutro Abs: 10.2 10*3/uL — ABNORMAL HIGH (ref 1.7–7.7)
Neutrophils Relative %: 79 %
Platelets: 295 10*3/uL (ref 150–400)
RBC: 4.24 MIL/uL (ref 4.22–5.81)
RDW: 14.8 % (ref 11.5–15.5)
WBC: 12.9 10*3/uL — AB (ref 4.0–10.5)

## 2018-04-24 LAB — ETHANOL: Alcohol, Ethyl (B): 69 mg/dL — ABNORMAL HIGH (ref <10)

## 2018-04-24 LAB — I-STAT CG4 LACTIC ACID, ED
LACTIC ACID, VENOUS: 1.41 mmol/L (ref 0.5–1.9)
Lactic Acid, Venous: 3.18 mmol/L (ref 0.5–1.9)

## 2018-04-24 LAB — MAGNESIUM: MAGNESIUM: 1.2 mg/dL — AB (ref 1.7–2.4)

## 2018-04-24 LAB — PHOSPHORUS: Phosphorus: 1.5 mg/dL — ABNORMAL LOW (ref 2.5–4.6)

## 2018-04-24 MED ORDER — SODIUM CHLORIDE 0.9 % IV BOLUS
1000.0000 mL | Freq: Once | INTRAVENOUS | Status: AC
  Administered 2018-04-24: 1000 mL via INTRAVENOUS

## 2018-04-24 MED ORDER — SODIUM CHLORIDE 0.9 % IV SOLN
1500.0000 mg | Freq: Once | INTRAVENOUS | Status: AC
  Administered 2018-04-24: 1500 mg via INTRAVENOUS
  Filled 2018-04-24: qty 1500

## 2018-04-24 MED ORDER — SODIUM CHLORIDE 0.9% FLUSH
3.0000 mL | Freq: Two times a day (BID) | INTRAVENOUS | Status: DC
  Administered 2018-04-24 – 2018-04-25 (×2): 3 mL via INTRAVENOUS

## 2018-04-24 MED ORDER — VANCOMYCIN HCL IN DEXTROSE 1-5 GM/200ML-% IV SOLN: 1000.0000 mg | Freq: Once | INTRAVENOUS | Status: DC

## 2018-04-24 MED ORDER — SODIUM CHLORIDE 0.9 % IV SOLN
INTRAVENOUS | Status: DC
  Administered 2018-04-24 – 2018-04-26 (×4): via INTRAVENOUS

## 2018-04-24 MED ORDER — VANCOMYCIN HCL IN DEXTROSE 1-5 GM/200ML-% IV SOLN: 1000.0000 mg | Freq: Two times a day (BID) | INTRAVENOUS | Status: DC

## 2018-04-24 MED ORDER — SODIUM CHLORIDE 0.9 % IV BOLUS
500.0000 mL | Freq: Once | INTRAVENOUS | Status: AC
  Administered 2018-04-24: 500 mL via INTRAVENOUS

## 2018-04-24 MED ORDER — LORAZEPAM 2 MG/ML IJ SOLN
0.0000 mg | Freq: Two times a day (BID) | INTRAMUSCULAR | Status: DC
  Administered 2018-04-26: 1 mg via INTRAVENOUS
  Filled 2018-04-24: qty 1

## 2018-04-24 MED ORDER — MAGNESIUM SULFATE 2 GM/50ML IV SOLN
2.0000 g | Freq: Once | INTRAVENOUS | Status: AC
  Administered 2018-04-24: 2 g via INTRAVENOUS
  Filled 2018-04-24: qty 50

## 2018-04-24 MED ORDER — SODIUM CHLORIDE 0.9 % IV BOLUS: 1000.0000 mL | Freq: Once | INTRAVENOUS | Status: DC

## 2018-04-24 MED ORDER — ENOXAPARIN SODIUM 40 MG/0.4ML ~~LOC~~ SOLN
40.0000 mg | SUBCUTANEOUS | Status: DC
  Administered 2018-04-24 – 2018-04-26 (×3): 40 mg via SUBCUTANEOUS
  Filled 2018-04-24 (×3): qty 0.4

## 2018-04-24 MED ORDER — PIPERACILLIN-TAZOBACTAM 3.375 G IVPB 30 MIN
3.3750 g | Freq: Once | INTRAVENOUS | Status: AC
  Administered 2018-04-24: 3.375 g via INTRAVENOUS
  Filled 2018-04-24: qty 50

## 2018-04-24 MED ORDER — ACETAMINOPHEN 500 MG PO TABS
1000.0000 mg | ORAL_TABLET | Freq: Three times a day (TID) | ORAL | Status: DC
  Administered 2018-04-24 – 2018-04-26 (×5): 1000 mg via ORAL
  Filled 2018-04-24 (×5): qty 2

## 2018-04-24 MED ORDER — THIAMINE HCL 100 MG/ML IJ SOLN
100.0000 mg | Freq: Every day | INTRAMUSCULAR | Status: DC
  Filled 2018-04-24: qty 2

## 2018-04-24 MED ORDER — FOLIC ACID 1 MG PO TABS
1.0000 mg | ORAL_TABLET | Freq: Every day | ORAL | Status: DC
  Administered 2018-04-24 – 2018-04-26 (×3): 1 mg via ORAL
  Filled 2018-04-24 (×3): qty 1

## 2018-04-24 MED ORDER — VITAMIN B-1 100 MG PO TABS
100.0000 mg | ORAL_TABLET | Freq: Every day | ORAL | Status: DC
  Administered 2018-04-24 – 2018-04-27 (×4): 100 mg via ORAL
  Filled 2018-04-24 (×4): qty 1

## 2018-04-24 MED ORDER — PIPERACILLIN-TAZOBACTAM 3.375 G IVPB
3.3750 g | Freq: Three times a day (TID) | INTRAVENOUS | Status: DC
  Administered 2018-04-24 – 2018-04-27 (×8): 3.375 g via INTRAVENOUS
  Filled 2018-04-24 (×9): qty 50

## 2018-04-24 MED ORDER — LORAZEPAM 2 MG/ML IJ SOLN
0.0000 mg | Freq: Four times a day (QID) | INTRAMUSCULAR | Status: AC
  Administered 2018-04-24: 2 mg via INTRAVENOUS
  Administered 2018-04-25 (×2): 1 mg via INTRAVENOUS
  Administered 2018-04-25: 2 mg via INTRAVENOUS
  Administered 2018-04-26 (×2): 1 mg via INTRAVENOUS
  Filled 2018-04-24 (×6): qty 1

## 2018-04-24 MED ORDER — ACETAMINOPHEN 500 MG PO TABS
1000.0000 mg | ORAL_TABLET | Freq: Once | ORAL | Status: AC
  Administered 2018-04-24: 1000 mg via ORAL
  Filled 2018-04-24: qty 2

## 2018-04-24 NOTE — ED Triage Notes (Signed)
Per ems pt with new tremors this morning. Tremors noted to BUE. Pt drinks 40oz of beer most days. Last drink was 2 cups beer this morning.No neuro deficits noted.

## 2018-04-24 NOTE — ED Provider Notes (Signed)
[facility] EMERGENCY DEPARTMENT Provider Note   CSN: [id number] Arrival date & time: 04/24/18  1202     History   Chief Complaint Chief Complaint  Patient presents with  . Tremors    HPI Raymond [name] is a 62 y.o. male.  HPI   62 year old male with history of chronic alcohol abuse here with nausea, general fatigue, and tremors.  Patient states that he has felt generally unwell for the last 2 3 days.  Is had poor appetite.  Has had increased urinary frequency and some dark urine.  He said associated subjective fevers.  He felt generally weak and was unable to get around the house today so he subsequently presents for evaluation.  He does state that he is continued to drink alcohol and had "2 cups" this morning.  Denies any recent increase or decrease in his alcohol intake.  Denies any current abdominal pain or chest pain.  No cough or sputum production.  No known recent sick contacts.  Denies any testicular or penile pain or discharge.  Past Medical History:  Diagnosis Date  . Seizures [facility])     Patient Active Problem List   Diagnosis Date Noted  . Sepsis (HCC) 04/24/2018  . Alcohol use with alcohol-induced disorder (HCC) 04/24/2018  . Hypokalemia 04/15/2013  . Convulsions (HCC) 04/14/2013  . Rhabdomyolysis 04/14/2013  . Metabolic acidosis 04/14/2013    Past Surgical History:  Procedure Laterality Date  . TONSILLECTOMY          Home Medications    Prior to Admission medications   Medication Sig Start Date End Date Taking? Authorizing Provider  benzonatate (TESSALON) 100 MG capsule Take 1 capsule (100 mg total) by mouth every 8 (eight) hours. Patient not taking: Reported on 07/15/2017 04/12/15   Elson Areas, PA-C  ibuprofen (ADVIL,MOTRIN) 800 MG tablet Take 1 tablet (800 mg total) by mouth 3 (three) times daily. Patient not taking: Reported on 07/15/2017 04/12/15   Osie Cheeks    Family History Family History  Problem Relation Age of  Onset  . Other Neg Hx     Social History Social History   Tobacco Use  . Smoking status: Never Smoker  . Smokeless tobacco: Never Used  Substance Use Topics  . Alcohol use: Yes    Comment: pt reports drinking a pint a day  . Drug use: No     Allergies   Patient has no known allergies.   Review of Systems Review of Systems  Constitutional: Positive for chills and fatigue. Negative for fever.  HENT: Negative for ear pain and sore throat.   Eyes: Negative for pain and visual disturbance.  Respiratory: Negative for cough and shortness of breath.   Cardiovascular: Negative for chest pain and palpitations.  Gastrointestinal: Positive for nausea. Negative for abdominal pain and vomiting.  Genitourinary: Positive for dysuria and frequency. Negative for hematuria.  Musculoskeletal: Negative for arthralgias and back pain.  Skin: Negative for color change and rash.  Neurological: Positive for tremors. Negative for seizures and syncope.  All other systems reviewed and are negative.    Physical Exam Updated Vital Signs BP (!) 154/130   Pulse (!) 133   Temp (!) 103.3 F (39.6 C) (Oral)   Resp 18   Ht 5\' 11"  (1.803 m)   Wt 61.3 kg (135 lb 2.3 oz)   SpO2 96%   BMI 18.85 kg/m   Physical Exam  Constitutional: He is oriented to person, place, and time. He appears  well-developed and well-nourished. No distress.  HENT:  Head: Normocephalic and atraumatic.  Dry mucous membranes  Eyes: Conjunctivae are normal.  Neck: Neck supple.  Cardiovascular: Regular rhythm and normal heart sounds. Tachycardia present. Exam reveals no friction rub.  No murmur heard. Pulmonary/Chest: Effort normal and breath sounds normal. No respiratory distress. He has no wheezes. He has no rales.  Abdominal: Soft. He exhibits no distension.  Musculoskeletal: He exhibits no edema.  Neurological: He is alert and oriented to person, place, and time. He exhibits normal muscle tone.  Skin: Skin is warm.  Capillary refill takes less than 2 seconds.  Psychiatric: He has a normal mood and affect.  Nursing note and vitals reviewed.    ED Treatments / Results  Labs (all labs ordered are listed, but only abnormal results are displayed) Labs Reviewed  CBC WITH DIFFERENTIAL/PLATELET - Abnormal; Notable for the following components:      Result Value   WBC 12.9 (*)    HCT 38.9 (*)    Neutro Abs 10.2 (*)    Monocytes Absolute 1.3 (*)    All other components within normal limits  COMPREHENSIVE METABOLIC PANEL - Abnormal; Notable for the following components:   Sodium 131 (*)    Chloride 92 (*)    Glucose, Bld 105 (*)    BUN <5 (*)    Albumin 2.7 (*)    All other components within normal limits  URINALYSIS, ROUTINE W REFLEX MICROSCOPIC - Abnormal; Notable for the following components:   APPearance CLOUDY (*)    Specific Gravity, Urine <1.005 (*)    Hgb urine dipstick SMALL (*)    Nitrite POSITIVE (*)    Leukocytes, UA LARGE (*)    All other components within normal limits  ETHANOL - Abnormal; Notable for the following components:   Alcohol, Ethyl (B) 69 (*)    All other components within normal limits  MAGNESIUM - Abnormal; Notable for the following components:   Magnesium 1.2 (*)    All other components within normal limits  URINALYSIS, MICROSCOPIC (REFLEX) - Abnormal; Notable for the following components:   Bacteria, UA MANY (*)    All other components within normal limits  PHOSPHORUS - Abnormal; Notable for the following components:   Phosphorus 1.5 (*)    All other components within normal limits  I-STAT CG4 LACTIC ACID, ED - Abnormal; Notable for the following components:   Lactic Acid, Venous 3.18 (*)    All other components within normal limits  CULTURE, BLOOD (ROUTINE X 2)  CULTURE, BLOOD (ROUTINE X 2)  URINE CULTURE  COMPREHENSIVE METABOLIC PANEL  CBC  MAGNESIUM  PHOSPHORUS  HIV ANTIBODY (ROUTINE TESTING)  I-STAT CG4 LACTIC ACID, ED    EKG None  Radiology Dg  Chest 2 View  Result Date: 04/24/2018 CLINICAL DATA:  Fever and weakness EXAM: CHEST - 2 VIEW COMPARISON:  July 15, 2017 FINDINGS: There is no edema or consolidation. Heart size and pulmonary vascularity are normal. No adenopathy. There are several prior healed rib fractures on the right. There are foci of coronary artery calcification. IMPRESSION: No edema or consolidation. Foci of coronary artery calcification noted. Several healed rib fractures on the right. Electronically Signed   By: Bretta BangWilliam  Woodruff III M.D.   On: 04/24/2018 13:28    Procedures .Critical Care Performed by: Shaune PollackIsaacs, Kyandra Mcclaine, MD Authorized by: Shaune PollackIsaacs, Braidyn Peace, MD   Critical care provider statement:    Critical care time (minutes):  35   Critical care time was exclusive of:  Separately billable procedures and treating other patients and teaching time   Critical care was necessary to treat or prevent imminent or life-threatening deterioration of the following conditions:  Cardiac failure, circulatory failure and sepsis   Critical care was time spent personally by me on the following activities:  Development of treatment plan with patient or surrogate, discussions with consultants, evaluation of patient's response to treatment, examination of patient, obtaining history from patient or surrogate, ordering and performing treatments and interventions, ordering and review of laboratory studies, ordering and review of radiographic studies, pulse oximetry, re-evaluation of patient's condition and review of old charts   I assumed direction of critical care for this patient from another provider in my specialty: no     (including critical care time)  Medications Ordered in ED Medications  piperacillin-tazobactam (ZOSYN) IVPB 3.375 g (3.375 g Intravenous New Bag/Given 04/24/18 2030)  enoxaparin (LOVENOX) injection 40 mg (40 mg Subcutaneous Given 04/24/18 2030)  sodium chloride flush (NS) 0.9 % injection 3 mL (3 mLs Intravenous Given  04/24/18 2045)  LORazepam (ATIVAN) injection 0-4 mg (2 mg Intravenous Given 04/24/18 2031)    Followed by  LORazepam (ATIVAN) injection 0-4 mg (has no administration in time range)  folic acid (FOLVITE) tablet 1 mg (1 mg Oral Given 04/24/18 2030)  thiamine (VITAMIN B-1) tablet 100 mg (has no administration in time range)    Or  thiamine (B-1) injection 100 mg (has no administration in time range)  0.9 %  sodium chloride infusion ( Intravenous New Bag/Given 04/24/18 2029)  acetaminophen (TYLENOL) tablet 1,000 mg (1,000 mg Oral Given 04/24/18 2029)  sodium chloride 0.9 % bolus 1,000 mL (0 mLs Intravenous Stopped 04/24/18 1544)  acetaminophen (TYLENOL) tablet 1,000 mg (1,000 mg Oral Given 04/24/18 1446)  piperacillin-tazobactam (ZOSYN) IVPB 3.375 g (0 g Intravenous Stopped 04/24/18 1545)  sodium chloride 0.9 % bolus 1,000 mL (0 mLs Intravenous Stopped 04/24/18 1752)  sodium chloride 0.9 % bolus 500 mL (0 mLs Intravenous Stopped 04/24/18 1752)  vancomycin (VANCOCIN) 1,500 mg in sodium chloride 0.9 % 500 mL IVPB (0 mg Intravenous Stopped 04/24/18 1837)  magnesium sulfate IVPB 2 g 50 mL (0 g Intravenous Stopped 04/24/18 1753)     Initial Impression / Assessment and Plan / ED Course  I have reviewed the triage vital signs and the nursing notes.  Pertinent labs & imaging results that were available during my care of the patient were reviewed by me and considered in my medical decision making (see chart for details).   62 year old male here with generalized body aches, chills, and urinary frequency.  Lab work shows lactic acidosis, leukocytosis, and urinalysis consistent with UTI.  Concern for sepsis to UTI.  His abdomen is soft, nontender, nondistended, with no CVA tenderness.  Do not feel further imaging indicated given serial neg abd exams but will need to be monitored. Will replace electrolytes, start broad-spectrum antibiotics, fluids, and admit. Will need close monitoring for EtOH withdrawal. Do not  suspect this is contributing to his current presentation but he is certainly high risk for this.   Final Clinical Impressions(s) / ED Diagnoses   Final diagnoses:  Sepsis due to urinary tract infection (HCC)  Uncomplicated alcohol dependence [facility])    ED Discharge Orders    None       Shaune Pollack, MD 04/24/18 2214

## 2018-04-24 NOTE — ED Notes (Signed)
[facility]

## 2018-04-24 NOTE — Progress Notes (Signed)
Pharmacy Antibiotic Note  Raymond KansasMatthew [name] is a 62 y.o. male admitted on 04/24/2018 with sepsis.  Pharmacy has been consulted for vancomycin and Zosyn dosing. Pt febrile with elevated lactate.  Plan: -Vancomycin 1500mg  IV x1 then 1000mg  IV q12hr -Zosyn 3.375g IV over 30 min x1 then 3.375g IV q8h EI -Monitor renal function, cultures, LOT -Obtain vancomycin level as indicated   Height: 5\' 11"  (180.3 cm) Weight: 155 lb (70.3 kg) IBW/kg (Calculated) : 75.3  Temp (24hrs), Avg:103.1 F (39.5 C), Min:103.1 F (39.5 C), Max:103.1 F (39.5 C)  Recent Labs  Lab 04/24/18 1406  LATICACIDVEN 3.18*    CrCl cannot be calculated (Patient's most recent lab result is older than the maximum 21 days allowed.).    No Known Allergies  Antimicrobials this admission: Vancomycin 6/14 >>  Zosyn 6/14 >>   Dose adjustments this admission: none  Microbiology results: collected  Thank you for allowing pharmacy to be a part of this patient's care.  Fredonia HighlandMichael Bitonti, PharmD, BCPS PGY-2 Cardiology Pharmacy Resident Phone: [phone] 04/24/2018

## 2018-04-24 NOTE — Progress Notes (Signed)
Patient admitted to 5W-14 at [address]. Placed on stepdown monitor M10. Patient A&Ox4. Patient had tremors at time of admission and refused to allow staff to change soiled underwear and pants at this time. Currently no IV poles available on unit, oncoming nurse aware.

## 2018-04-24 NOTE — ED Notes (Signed)
Lab results reported to Nurse Tray Jordon.

## 2018-04-24 NOTE — H&P (Signed)
Date: 04/24/2018               Patient Name:  Raymond [name] MRN: [id number]  DOB: [birth year] Age / Sex: 62 y.o., male   PCP: Hilbert Corrigan Chales Abrahams, NP         Medical Service: Internal Medicine Teaching Service         Attending Physician: Dr. Levert Feinstein, MD    First Contact: Dr. Evelene Croon  Pager: [phone]  Second Contact: Dr. Vincente Liberty  Pager: [phone]       After Hours (After 5p/  First Contact Pager: [phone]  weekends / holidays): Second Contact Pager: [phone]   Chief Complaint: Weakness   History of Present Illness:  Raymond [name] is a 62 year old male with a history of chronic alcohol use disorder who presents with 2-day history of weakness and "dehydration".  He was in his usual state of health until 2 days ago when he started to feel weak. He has been experiencing chills during this time, but does not know if he has been febrile. He thought he was dehydrated during this time and has been drinking fluids. No chronic indwelling foley catheter. He denies HA, dizziness, chest pain, SOB , cough, abdominal pain, N/V, pain or burning with urination, penile discharge, and changes in bowel movements. He was however noted to void 2 times in the span of 10 minutes and did have urgency as well during our encounter. Denies previous history of UTIs.  ED course: Patient was febrile with T 103.3, tachycardic HR 118, normotensive, and oxygenating well on room air. Blood work remarkable for WBC 12.9, lactic acidosis 3.18, Na 131, Mag 1.2, and BAL 69. Renal function normal. UA with cloudy urine and large leukocytes and nitrites. CXR unremarkable.  He received 3L IVF and magnesium, and was started on broad spectrum antibiotics with vancomycin and Zosyn.  Meds: Does not take medications.    Allergies: Allergies as of 04/24/2018  . (No Known Allergies)   Past Medical History:  Diagnosis Date  . Seizures (HCC)     Family History:  Family History  Problem Relation Age of Onset  . Other Neg Hx      Social History: Does not smoke or use illicit drugs. Does report active drinking, 1 24oz can 4x/week, though he did reported a larger quantity to the ED provider.   Review of Systems: A complete ROS was negative except as per HPI.   Physical Exam: Blood pressure 137/85, pulse (!) 121, temperature (!) 103.3 F (39.6 C), temperature source Oral, resp. rate (!) 24, height 5\' 11"  (1.803 m), weight 155 lb (70.3 kg), SpO2 96 %.  Physical Exam  Constitutional: He is oriented to person, place, and time.  Malnourished and disheveled male laying in bed voiding.  Rigors and diaphoretic but in no acute distress  HENT:  Dry MM  Eyes: Conjunctivae are normal.  Cardiovascular:  Tachycardic, normal S1 and S2, no MRG  Pulmonary/Chest: Effort normal. No respiratory distress. He has no wheezes. He has no rales.  Abdominal:  Abdomen is soft, nondistended, nontender with normoactive bowel sounds.  He does have suprapubic tenderness and involuntary guarding on palpation of suprapubic region. Underwear covered in urine.   Musculoskeletal: He exhibits no edema or deformity.  Neurological: He is alert and oriented to person, place, and time.    EKG: pending  CXR: personally reviewed my interpretation is no acute cardiopulmonary process noted  Assessment & Plan by Problem: Principal Problem:   Sepsis (HCC)  Active Problems:   Alcohol use with alcohol-induced disorder (HCC)  Marita KansasMatthew Tyson is a 62 year old male with a history of chronic alcohol use disorder who presents with 2-day history of weakness  and was found to have a high grade fever and tachycardia with UA consistent with UTI. Exam is remarkable for a chronically ill-appearing male with rigors and involuntary guarding with palpation of suprapubic region. His presentation is consistent with urosepsis and will manage with IVF and broad-spectrum antibiotics as below.  Differential also includes pyelonephritis and bacteremia.  Work-up as below.  #  Sepsis secondary to UTI: Patient presents with urinary frequency and urgency and was found to have a high grade fever + tachycardia meeting sepsis criteria. UA dirty. No prior history of UTI or use of indwelling foley catheter. On IVF and broad spectrum antibiotics as below.  - Admit to stepdown  - Lactic acidosis resolved after 3L NS  - Discontinue vancomycin  - Continue Zosyn  - NS @ 125 cc  - Follow up blood cx and Ucx  - CBC, BMP, Mg, and P in AM  - Tylenol 650 mg q6h PRN for fever  - Consider CT renal protocol to evaluate for pyelonephritis if fails to improve clinically within 24-48h with above interventions   # Alcohol use disorder: Inconsistent report of alcohol use. Denies history of withdrawal, seizures, and DTs, but does have a history of withdrawal and seizures in chart. Blood work with mild hyponatremia and hypomagnesemia. Will continue to monitor.  - Admit to stepdown  - CIWA protocol with Ativan ordered  - Will monitor electrolytes and replete as needed   F: NS @ 125 cc/hr  E: monitoring and replete PRN  N: HH  VTE ppx: SQ Lovenox   Code status: Full code, not confirmed on admission   Dispo: Admit patient to Inpatient with expected length of stay greater than 2 midnights.  SignedBurna Cash: Santos-Sanchez, Nerine Pulse, MD 04/24/2018, 6:35 PM  Pager: [phone]

## 2018-04-25 DIAGNOSIS — F1099 Alcohol use, unspecified with unspecified alcohol-induced disorder: Secondary | ICD-10-CM

## 2018-04-25 DIAGNOSIS — E46 Unspecified protein-calorie malnutrition: Secondary | ICD-10-CM

## 2018-04-25 DIAGNOSIS — A419 Sepsis, unspecified organism: Principal | ICD-10-CM

## 2018-04-25 LAB — CBC
HEMATOCRIT: 34.4 % — AB (ref 39.0–52.0)
HEMOGLOBIN: 11.7 g/dL — AB (ref 13.0–17.0)
MCH: 31 pg (ref 26.0–34.0)
MCHC: 34 g/dL (ref 30.0–36.0)
MCV: 91 fL (ref 78.0–100.0)
Platelets: 274 10*3/uL (ref 150–400)
RBC: 3.78 MIL/uL — AB (ref 4.22–5.81)
RDW: 14.9 % (ref 11.5–15.5)
WBC: 19.9 10*3/uL — ABNORMAL HIGH (ref 4.0–10.5)

## 2018-04-25 LAB — COMPREHENSIVE METABOLIC PANEL
ALK PHOS: 48 U/L (ref 38–126)
ALT: 16 U/L — ABNORMAL LOW (ref 17–63)
ANION GAP: 8 (ref 5–15)
AST: 29 U/L (ref 15–41)
Albumin: 2.3 g/dL — ABNORMAL LOW (ref 3.5–5.0)
BUN: <5 mg/dL — ABNORMAL LOW (ref 6–20)
CALCIUM: 8.2 mg/dL — AB (ref 8.9–10.3)
CO2: 25 mmol/L (ref 22–32)
Chloride: 100 mmol/L — ABNORMAL LOW (ref 101–111)
Creatinine, Ser: 0.7 mg/dL (ref 0.61–1.24)
GFR calc non Af Amer: >60 mL/min (ref >60)
Glucose, Bld: 104 mg/dL — ABNORMAL HIGH (ref 65–99)
Potassium: 3.1 mmol/L — ABNORMAL LOW (ref 3.5–5.1)
SODIUM: 133 mmol/L — AB (ref 135–145)
Total Bilirubin: 0.9 mg/dL (ref 0.3–1.2)
Total Protein: 7.6 g/dL (ref 6.5–8.1)

## 2018-04-25 LAB — BASIC METABOLIC PANEL
Anion gap: 10 (ref 5–15)
BUN: 6 mg/dL (ref 6–20)
CHLORIDE: 98 mmol/L — AB (ref 101–111)
CO2: 21 mmol/L — AB (ref 22–32)
Calcium: 8.1 mg/dL — ABNORMAL LOW (ref 8.9–10.3)
Creatinine, Ser: 0.86 mg/dL (ref 0.61–1.24)
GFR calc Af Amer: >60 mL/min (ref >60)
GLUCOSE: 105 mg/dL — AB (ref 65–99)
POTASSIUM: 4.2 mmol/L (ref 3.5–5.1)
Sodium: 129 mmol/L — ABNORMAL LOW (ref 135–145)

## 2018-04-25 LAB — HIV ANTIBODY (ROUTINE TESTING W REFLEX): HIV Screen 4th Generation wRfx: NONREACTIVE

## 2018-04-25 LAB — MAGNESIUM
MAGNESIUM: 1.6 mg/dL — AB (ref 1.7–2.4)
Magnesium: 1.9 mg/dL (ref 1.7–2.4)

## 2018-04-25 LAB — PHOSPHORUS: PHOSPHORUS: 2.3 mg/dL — AB (ref 2.5–4.6)

## 2018-04-25 MED ORDER — SODIUM CHLORIDE 0.9 % IV BOLUS
1000.0000 mL | Freq: Once | INTRAVENOUS | Status: AC
  Administered 2018-04-25: 1000 mL via INTRAVENOUS

## 2018-04-25 MED ORDER — PROSIGHT PO TABS
1.0000 | ORAL_TABLET | Freq: Every day | ORAL | Status: DC
  Administered 2018-04-25 – 2018-04-27 (×3): 1 via ORAL
  Filled 2018-04-25 (×3): qty 1

## 2018-04-25 MED ORDER — K PHOS MONO-SOD PHOS DI & MONO 155-852-130 MG PO TABS
500.0000 mg | ORAL_TABLET | Freq: Every day | ORAL | Status: AC
  Administered 2018-04-25 – 2018-04-27 (×3): 500 mg via ORAL
  Filled 2018-04-25 (×3): qty 2

## 2018-04-25 MED ORDER — ENSURE ENLIVE PO LIQD
237.0000 mL | Freq: Three times a day (TID) | ORAL | Status: DC
  Administered 2018-04-25 – 2018-04-27 (×7): 237 mL via ORAL

## 2018-04-25 MED ORDER — MAGNESIUM SULFATE 2 GM/50ML IV SOLN
2.0000 g | Freq: Once | INTRAVENOUS | Status: AC
  Administered 2018-04-25: 2 g via INTRAVENOUS
  Filled 2018-04-25: qty 50

## 2018-04-25 MED ORDER — LOPERAMIDE HCL 2 MG PO CAPS
2.0000 mg | ORAL_CAPSULE | ORAL | Status: DC | PRN
  Administered 2018-04-25 (×2): 2 mg via ORAL
  Filled 2018-04-25 (×2): qty 1

## 2018-04-25 MED ORDER — POTASSIUM CHLORIDE CRYS ER 20 MEQ PO TBCR
40.0000 meq | EXTENDED_RELEASE_TABLET | Freq: Once | ORAL | Status: AC
  Administered 2018-04-25: 40 meq via ORAL
  Filled 2018-04-25: qty 2

## 2018-04-25 MED ORDER — SODIUM CHLORIDE 0.9% FLUSH: 10.0000 mL | INTRAVENOUS | Status: DC | PRN

## 2018-04-25 MED ORDER — POTASSIUM CHLORIDE 10 MEQ/100ML IV SOLN
10.0000 meq | INTRAVENOUS | Status: AC
Start: 2018-04-25 — End: 2018-04-25
  Administered 2018-04-25 (×5): 10 meq via INTRAVENOUS
  Filled 2018-04-25 (×5): qty 100

## 2018-04-25 NOTE — Progress Notes (Signed)
   Subjective:  No acute events overnight. Patient doing well this morning. Eating breakfast when seen during rounds. No complaints this AM.   Objective:  Vital signs in last 24 hours: Vitals:   04/24/18 2305 04/25/18 0114 04/25/18 0303 04/25/18 0743  BP: 105/67  (!) 154/98 (!) 160/91  Pulse: (!) 132   (!) 105  Resp:   13 18  Temp:  98.9 F (37.2 C) 99 F (37.2 C) 99.4 F (37.4 C)  TempSrc:  Oral Oral Oral  SpO2: 98%  98% 100%  Weight:    129 lb 10.1 oz (58.8 kg)  Height:       Physical Exam  Constitutional:  Malnourished and disheveled male eating breakfast in bed in no acute distress  Cardiovascular:  Tachycardic, nl S1/2, no mrg   Pulmonary/Chest: Effort normal and breath sounds normal. No respiratory distress. He has no wheezes. He has no rales.  Abdominal: Soft. Bowel sounds are normal. He exhibits no distension. There is no tenderness.  No suprapubic tenderness  Musculoskeletal: He exhibits no edema.  Neurological: He is alert.  Able to move all 4 extremities spontaneously without focal deficits noted     Assessment/Plan:  Principal Problem:   Sepsis (HCC) Active Problems:   Alcohol use with alcohol-induced disorder (HCC)  # Sepsis secondary to UTI: Patient presents with urinary frequency and urgency and was found to have a high grade fever + tachycardia meeting sepsis criteria. UA dirty. No prior history of UTI or use of indwelling foley catheter. On IVF and broad spectrum antibiotics as below.  - Continue Zosyn  - NS @ 125 cc  - Follow up blood cx and Ucx  - Tylenol 650 mg q6h PRN for fever  - Consider CT renal protocol to evaluate for pyelonephritis if fails to improve clinically within 24-48h with above interventions   # Alcohol use disorder: Received Ativan 2 mg x2. Will continue to monitor for signs of withdrawal.  - CIWA protocol with Ativan ordered  - Will monitor electrolytes and replete as needed  - MVI + folic acid + thiamine    Dispo:  Anticipated discharge in approximately 2-3 day(s) pending improvement in clinical status.   Burna CashSantos-Sanchez, Nicholus Chandran, MD 04/25/2018, 8:02 AM Pager: [phone]

## 2018-04-26 DIAGNOSIS — R000 Tachycardia, unspecified: Secondary | ICD-10-CM

## 2018-04-26 DIAGNOSIS — E878 Other disorders of electrolyte and fluid balance, not elsewhere classified: Secondary | ICD-10-CM

## 2018-04-26 LAB — CBC
HEMATOCRIT: 32.3 % — AB (ref 39.0–52.0)
HEMOGLOBIN: 10.9 g/dL — AB (ref 13.0–17.0)
MCH: 30.9 pg (ref 26.0–34.0)
MCHC: 33.7 g/dL (ref 30.0–36.0)
MCV: 91.5 fL (ref 78.0–100.0)
Platelets: 258 10*3/uL (ref 150–400)
RBC: 3.53 MIL/uL — ABNORMAL LOW (ref 4.22–5.81)
RDW: 15.2 % (ref 11.5–15.5)
WBC: 17.2 10*3/uL — ABNORMAL HIGH (ref 4.0–10.5)

## 2018-04-26 LAB — BASIC METABOLIC PANEL
Anion gap: 10 (ref 5–15)
CHLORIDE: 99 mmol/L — AB (ref 101–111)
CO2: 21 mmol/L — AB (ref 22–32)
CREATININE: 0.77 mg/dL (ref 0.61–1.24)
Calcium: 8.2 mg/dL — ABNORMAL LOW (ref 8.9–10.3)
GFR calc non Af Amer: >60 mL/min (ref >60)
GLUCOSE: 115 mg/dL — AB (ref 65–99)
Potassium: 3.4 mmol/L — ABNORMAL LOW (ref 3.5–5.1)
Sodium: 130 mmol/L — ABNORMAL LOW (ref 135–145)

## 2018-04-26 LAB — RENAL FUNCTION PANEL
ANION GAP: 12 (ref 5–15)
Albumin: 2.5 g/dL — ABNORMAL LOW (ref 3.5–5.0)
BUN: <5 mg/dL — ABNORMAL LOW (ref 6–20)
CHLORIDE: 96 mmol/L — AB (ref 101–111)
CO2: 22 mmol/L (ref 22–32)
CREATININE: 0.73 mg/dL (ref 0.61–1.24)
Calcium: 8.7 mg/dL — ABNORMAL LOW (ref 8.9–10.3)
Glucose, Bld: 66 mg/dL (ref 65–99)
Phosphorus: 2.3 mg/dL — ABNORMAL LOW (ref 2.5–4.6)
Potassium: 4.2 mmol/L (ref 3.5–5.1)
Sodium: 130 mmol/L — ABNORMAL LOW (ref 135–145)

## 2018-04-26 LAB — PHOSPHORUS: PHOSPHORUS: 1.7 mg/dL — AB (ref 2.5–4.6)

## 2018-04-26 LAB — MAGNESIUM
MAGNESIUM: 2 mg/dL (ref 1.7–2.4)
Magnesium: 1.4 mg/dL — ABNORMAL LOW (ref 1.7–2.4)

## 2018-04-26 MED ORDER — ACETAMINOPHEN 500 MG PO TABS
1000.0000 mg | ORAL_TABLET | Freq: Four times a day (QID) | ORAL | Status: DC | PRN
Start: 2018-04-26 — End: 2018-04-27

## 2018-04-26 MED ORDER — K PHOS MONO-SOD PHOS DI & MONO 155-852-130 MG PO TABS
500.0000 mg | ORAL_TABLET | Freq: Three times a day (TID) | ORAL | Status: DC
  Administered 2018-04-26 (×2): 500 mg via ORAL
  Filled 2018-04-26 (×4): qty 2

## 2018-04-26 MED ORDER — K PHOS MONO-SOD PHOS DI & MONO 155-852-130 MG PO TABS
500.0000 mg | ORAL_TABLET | Freq: Once | ORAL | Status: AC
  Administered 2018-04-26: 500 mg via ORAL
  Filled 2018-04-26: qty 2

## 2018-04-26 MED ORDER — K PHOS MONO-SOD PHOS DI & MONO 155-852-130 MG PO TABS
500.0000 mg | ORAL_TABLET | Freq: Three times a day (TID) | ORAL | Status: AC
  Administered 2018-04-26 – 2018-04-27 (×2): 500 mg via ORAL
  Filled 2018-04-26 (×2): qty 2

## 2018-04-26 MED ORDER — MAGNESIUM SULFATE 2 GM/50ML IV SOLN
2.0000 g | Freq: Once | INTRAVENOUS | Status: AC
  Administered 2018-04-26: 2 g via INTRAVENOUS
  Filled 2018-04-26: qty 50

## 2018-04-26 MED ORDER — POTASSIUM CHLORIDE CRYS ER 20 MEQ PO TBCR
40.0000 meq | EXTENDED_RELEASE_TABLET | Freq: Once | ORAL | Status: AC
  Administered 2018-04-26: 40 meq via ORAL
  Filled 2018-04-26: qty 2

## 2018-04-26 NOTE — Progress Notes (Signed)
   Subjective:  No acute events overnight. Good appetite without n/v. Still with several electrolytes abnormalities and requiring some prn ativan for EtOH wd.   Objective:  Vital signs in last 24 hours: Vitals:   04/26/18 0040 04/26/18 0420 04/26/18 0507 04/26/18 0756  BP: (!) 162/99  109/77 131/80  Pulse: (!) 101  95 81  Resp: (!) 23  16 17   Temp: 98.7 F (37.1 C) 99.1 F (37.3 C)  98.6 F (37 C)  TempSrc: Oral   Oral  SpO2: 100%  95% 98%  Weight:  134 lb 4.2 oz (60.9 kg)    Height:       Physical Exam  Constitutional:  Malnourished and disheveled male eating breakfast in bed in no acute distress  Cardiovascular:  Tachycardic, nl S1/2, no mrg   Pulmonary/Chest: Effort normal and breath sounds normal. No respiratory distress. He has no wheezes. He has no rales.  Abdominal: Soft. Bowel sounds are normal. He exhibits no distension. There is no tenderness.  No suprapubic tenderness  Musculoskeletal: He exhibits no edema.  Neurological: He is alert.  Able to move all 4 extremities spontaneously without focal deficits noted    Assessment/Plan:  Principal Problem:   Sepsis (HCC) Active Problems:   Hypokalemia   Alcohol use with alcohol-induced disorder (HCC)  Sepsis, UTI: Clinically improving; fever resolved, tachycardia improving and patient feeling better. Urine culture not yet resulted but blood cultures with NGTD. Will narrow antibiotics based on culture results but so far is doing well on Zosyn.  -Continue Zosyn; narrow based on culture results -DC IVF given good PO intake & resolution of tachycardia  -Tylenol prn fever  Alcohol use disorder: Recieved a total of 3mg  Ativan in past 24-hrs; CIWA scores low (<10).  -CIWA protocol with Ativan ordered prn  Malnutrition K, Phos and Mag all repleated yesterday however AM labs with Phos 1.7, Mag 1.4 and K 3.4. Suspect related to increased PO intake in setting of AUD.  -K-phos 500mg  PO TID today -2g Mag IV x1 -K-dur 40mEq  x1 -Recheck labs around 2-3 pm; replace as indicated  Dispo: Anticipated discharge in approximately 1-2 days pending return of urine culture/narrowing abx, stable electrolytes and continued clinical improvement.  Arelyn Gauer, DO 04/26/2018, 7:14 AM Pager: [phone]

## 2018-04-26 NOTE — Plan of Care (Signed)
  Problem: Safety: Goal: Ability to remain free from injury will improve Outcome: Progressing   

## 2018-04-27 DIAGNOSIS — D72829 Elevated white blood cell count, unspecified: Secondary | ICD-10-CM

## 2018-04-27 DIAGNOSIS — E876 Hypokalemia: Secondary | ICD-10-CM

## 2018-04-27 DIAGNOSIS — E878 Other disorders of electrolyte and fluid balance, not elsewhere classified: Secondary | ICD-10-CM

## 2018-04-27 DIAGNOSIS — Z1611 Resistance to penicillins: Secondary | ICD-10-CM

## 2018-04-27 DIAGNOSIS — B961 Klebsiella pneumoniae [K. pneumoniae] as the cause of diseases classified elsewhere: Secondary | ICD-10-CM

## 2018-04-27 LAB — CBC
HEMATOCRIT: 36.4 % — AB (ref 39.0–52.0)
HEMOGLOBIN: 12.2 g/dL — AB (ref 13.0–17.0)
MCH: 30.8 pg (ref 26.0–34.0)
MCHC: 33.5 g/dL (ref 30.0–36.0)
MCV: 91.9 fL (ref 78.0–100.0)
Platelets: 272 10*3/uL (ref 150–400)
RBC: 3.96 MIL/uL — AB (ref 4.22–5.81)
RDW: 14.7 % (ref 11.5–15.5)
WBC: 11.6 10*3/uL — ABNORMAL HIGH (ref 4.0–10.5)

## 2018-04-27 LAB — RENAL FUNCTION PANEL
ALBUMIN: 2.1 g/dL — AB (ref 3.5–5.0)
ANION GAP: 9 (ref 5–15)
BUN: 6 mg/dL (ref 6–20)
CALCIUM: 8.3 mg/dL — AB (ref 8.9–10.3)
CO2: 24 mmol/L (ref 22–32)
Chloride: 98 mmol/L — ABNORMAL LOW (ref 101–111)
Creatinine, Ser: 0.68 mg/dL (ref 0.61–1.24)
GFR calc Af Amer: >60 mL/min (ref >60)
GFR calc non Af Amer: >60 mL/min (ref >60)
GLUCOSE: 90 mg/dL (ref 65–99)
PHOSPHORUS: 3.9 mg/dL (ref 2.5–4.6)
Potassium: 3.4 mmol/L — ABNORMAL LOW (ref 3.5–5.1)
SODIUM: 131 mmol/L — AB (ref 135–145)

## 2018-04-27 LAB — MAGNESIUM: Magnesium: 1.5 mg/dL — ABNORMAL LOW (ref 1.7–2.4)

## 2018-04-27 LAB — URINE CULTURE

## 2018-04-27 MED ORDER — POTASSIUM CHLORIDE CRYS ER 20 MEQ PO TBCR: 60.0000 meq | EXTENDED_RELEASE_TABLET | Freq: Once | ORAL | Status: DC

## 2018-04-27 MED ORDER — CEPHALEXIN 500 MG PO CAPS
500.0000 mg | ORAL_CAPSULE | Freq: Two times a day (BID) | ORAL | Status: DC
  Administered 2018-04-27: 500 mg via ORAL
  Filled 2018-04-27: qty 1

## 2018-04-27 MED ORDER — CEPHALEXIN 500 MG PO CAPS: 500.0000 mg | ORAL_CAPSULE | Freq: Two times a day (BID) | ORAL | 0 refills | Status: AC

## 2018-04-27 MED ORDER — MAGNESIUM SULFATE 2 GM/50ML IV SOLN
2.0000 g | Freq: Once | INTRAVENOUS | Status: AC
  Administered 2018-04-27: 2 g via INTRAVENOUS
  Filled 2018-04-27: qty 50

## 2018-04-27 NOTE — Care Management Note (Signed)
Case Management Note  Patient Details  Name: Raymond [name] MRN: [id number] Date of Birth: 23-Jun-1956  Subjective/Objective:     Pt admitted with sepsis. He is from home alone. [name]               Action/Plan: Pt discharging home with self care. No f/u per PT. Pt with orders for a rolling walker. [name] notified and will deliver to the room.  Pt without transportation home. CM inquired about cab but pt states he has lost him money here at the hospital. CSW notified and provided a cab voucher for home. Pt provided card to assist with the cost of his d/c medications. He states with the card he can afford the medication.   Expected Discharge Date:  04/27/18               Expected Discharge Plan:  Home/Self Care  In-House Referral:  Clinical Social Work(transportation home)  Discharge planning Services  CM Consult, Medication Assistance  Post Acute Care Choice:  Durable Medical Equipment Choice offered to:  Patient  DME Arranged:  Walker rolling DME Agency:  [facility]  [facility]:     Status of Service:  Completed, signed off  If discussed at Long Length of Stay Meetings, dates discussed:    Additional Comments:  Raymond BaloKelli F Eryx Zane, RN 04/27/2018, 3:08 PM

## 2018-04-27 NOTE — Progress Notes (Signed)
Visited with patient who is preparing to be discharged. Provided pastoral presence and spiritual support through prayer.  Phebe CollaDonna S Alexzia Kasler, Chaplain   04/27/18 1500  Clinical Encounter Type  Visited With Patient  Visit Type Spiritual support  Referral From Nurse  Consult/Referral To Chaplain  Spiritual Encounters  Spiritual Needs Prayer  Stress Factors  Patient Stress Factors None identified  Family Stress Factors None identified

## 2018-04-27 NOTE — Progress Notes (Signed)
   Subjective:  No acute events overnight. Patient doing well this morning. Denies urinary symptoms. Did not report any complaints when seen during rounds.   Objective:  Vital signs in last 24 hours: Vitals:   04/26/18 2202 04/27/18 0041 04/27/18 0426 04/27/18 0431  BP: 138/63 112/74 121/79   Pulse: 96 (!) 103 100   Resp: 18 20 18    Temp: 99.2 F (37.3 C)  98.1 F (36.7 C)   TempSrc: Oral  Oral   SpO2: 93% 98% 98%   Weight:    135 lb 9.3 oz (61.5 kg)  Height:       Physical Exam  Constitutional: He is oriented to person, place, and time.  Malnourished elderly male laying in bed in no acute distress   Cardiovascular: Normal rate, regular rhythm and normal heart sounds. Exam reveals no gallop and no friction rub.  No murmur heard. Pulmonary/Chest: Effort normal and breath sounds normal. No respiratory distress. He has no wheezes. He has no rales.  Abdominal: Soft. Bowel sounds are normal. He exhibits no distension. There is no tenderness.  Musculoskeletal: He exhibits no edema.  Neurological: He is alert and oriented to person, place, and time.    Assessment/Plan:  Principal Problem:   Sepsis (HCC) Active Problems:   Hypokalemia   Alcohol use with alcohol-induced disorder (HCC)  # Sepsis secondary to UTI: Patient feeling back to baseline and denies urinary symptoms. He is afebrile and hemodynamically stable. Ucx with Klebsiella pneumoniae only R-ampicillin. Blood cx NGTD. Will transition to PO Keflex today.  - Switch Zosyn--> Keflex 500 mg BID  - Tylenol 650 mg q6h PRN for fever   # Alcohol use disorder:Received Ativan 2 mg x2 in the past 24 hours. Will continue to monitor for signs of withdrawal.  - CIWA protocol with Ativan ordered  - MVI + folic acid + thiamine   # Refeeding syndrome: Will continue to monitor electrolytes and replete as needed.   Dispo: Anticipated discharge in approximately today -1 day.   Burna CashSantos-Sanchez, Georgeanne Frankland, MD 04/27/2018, 6:55 AM Pager:  [phone]

## 2018-04-27 NOTE — Discharge Summary (Signed)
Name: Raymond [name] MRN: [id number] DOB: 05-26-1956 62 y.o. PCP: [name], [name], NP  Date of Admission: 04/24/2018 12:02 PM Date of Discharge: 04/27/2018 Attending Physician: Raymond [name], Nischal, MD  Discharge Diagnosis: 1. Sepsis secondary to UTI 2. Chronic alcohol abuse  3. Refeeding syndrome  Discharge Medications: Allergies as of 04/27/2018   No Known Allergies     Medication List    STOP taking these medications   benzonatate 100 MG capsule Commonly known as:  TESSALON   ibuprofen 800 MG tablet Commonly known as:  ADVIL,MOTRIN     TAKE these medications   cephALEXin 500 MG capsule Commonly known as:  KEFLEX Take 1 capsule (500 mg total) by mouth every 12 (twelve) hours.       Disposition and follow-up:   [facility]Raymond [name] [facility].  At the hospital follow up visit please address:  1.  Please assess for ongoing urinary symptoms and compliance with antibiotic therapy, he was discharged on Keflex 500 mg BID. Please continue to encourage alcohol cessation.   2.  Labs / imaging needed at time of follow-up: BMP to check K, Mag and Phos   3.  Pending labs/ test needing follow-up: None   Follow-up Appointments: Follow-up Information    [name], [name], NP. Schedule an appointment as soon as possible for a visit.   Why:  PLease call your primare care doctor to schedule a hospital follow up appointment within 1 week.  Contact information: [address] [place] [address] [phone]           Hospital Course by problem list:  1. Sepsis secondary to UTI: Patient presented with weakness, high grade fever and rigors and was noted to have increased urinary frequency as well as urgency. His UA showed many bacteria, leukocytes and nitrites and urine culture grew Klebsiella pneumonia R-ampicillin. He was initially started on broad spectrum antibiotics with Zosyn and narrowed to Keflex after sensitivity  data was obtained. He was discharged on Keflex 500 mg BID x 6 days.   2. Chronic alcohol abuse 3. Refeeding syndrome Patient reported drinking one 24 oz beer can four times a week. He was placed on CIWA protocol and required minimal Ativan during this admission. He was also noted to have a hypokalemia, hypomagnesemia and hypophosphatemia consistent with refeeding syndrome secondary to malnourished in the setting of alcohol abuse. Electrolytes were repleted and normalized at the time of discharge. He was counseled on alcohol cessation.    Discharge Vitals:   BP 104/71 (BP Location: Left Arm)   Pulse (!) 105   Temp 98.2 F (36.8 C) (Oral)   Resp (!) 22   Ht 5\' 11"  (1.803 m)   Wt 135 lb 9.3 oz (61.5 kg)   SpO2 98%   BMI 18.91 kg/m   Pertinent Labs, Studies, and Procedures:   CBC Latest Ref Rng & Units 04/27/2018 04/26/2018 04/25/2018  WBC 4.0 - 10.5 K/uL 11.6(H) 17.2(H) 19.9(H)  Hemoglobin 13.0 - 17.0 g/dL 12.2(L) 10.9(L) 11.7(L)  Hematocrit 39.0 - 52.0 % 36.4(L) 32.3(L) 34.4(L)  Platelets 150 - 400 K/uL 272 258 274   BMP Latest Ref Rng & Units 04/27/2018 04/26/2018 04/26/2018  Glucose 65 - 99 mg/dL 90 66 696(E115(H)  BUN 6 - 20 mg/dL 6 <9(B<5(L) <2(W<5(L)  Creatinine 0.61 - 1.24 mg/dL 4.130.68 2.440.73 0.100.77  Sodium 135 - 145 mmol/L 131(L) 130(L) 130(L)  Potassium 3.5 - 5.1 mmol/L 3.4(L) 4.2 3.4(L)  Chloride 101 - 111  mmol/L 98(L) 96(L) 99(L)  CO2 22 - 32 mmol/L 24 22 21(L)  Calcium 8.9 - 10.3 mg/dL 8.3(L) 8.7(L) 8.2(L)   Urinalysis    Component Value Date/Time   COLORURINE YELLOW 04/24/2018 1246   APPEARANCEUR CLOUDY (A) 04/24/2018 1246   LABSPEC <1.005 (L) 04/24/2018 1246   PHURINE 6.0 04/24/2018 1246   GLUCOSEU NEGATIVE 04/24/2018 1246   HGBUR SMALL (A) 04/24/2018 1246   BILIRUBINUR NEGATIVE 04/24/2018 1246   KETONESUR NEGATIVE 04/24/2018 1246   PROTEINUR NEGATIVE 04/24/2018 1246   UROBILINOGEN 1.0 06/29/2013 1335   NITRITE POSITIVE (A) 04/24/2018 1246   LEUKOCYTESUR LARGE (A) 04/24/2018 1246     Discharge Instructions: Discharge Instructions    Call MD for:   Complete by:  As directed    Call your doctor if you develop a fever again, rigors, and urinary symptoms such as pain or burning with urination or increased frequency or urgency.   Diet - low sodium heart healthy   Complete by:  As directed    Discharge instructions   Complete by:  As directed    You were admitted to the hospital for a urinary tract infection. We treated you with IV fluids and IV antibiotics while you were in the hospital. You will have to continue taking antibiotics for the next few days.   Keflex 500 mg twice a day for 6 days   Please call your primary care doctor and schedule a hospital follow up appointment with her. Please call us if you have any questions.   - Dr. Evelene Croon   Increase activity slowly   Complete by:  As directed       Signed: Burna Cash, MD 04/28/2018, 2:19 PM   Pager: [phone]

## 2018-04-27 NOTE — Progress Notes (Signed)
CSW received consult regarding taxi voucher. RNCM provided voucher to patient.  CSW signing off.  Osborne Cascoadia Jaquisha Frech LCSW [phone]

## 2018-04-27 NOTE — Evaluation (Signed)
Physical Therapy Evaluation Patient Details Name: Raymond [name] MRN: [id number] DOB: 13-Apr-1956 Today's Date: 04/27/2018   History of Present Illness  Patient is a 62 y/o male presenting with weakness and dehydration. Admitted with sepsis secondary to UTI. Patient with a PMH significant for alcohol abuse disorder and seizures.     Clinical Impression  Mr. Chiang is a 62 y/o male admitted with the above listed diagnosis. Prior to admission, patient reports he was independent with mobility. Today only requiring Sup-Min guard for general safety and line management. Use of RW for ambulation in hallway as patient feels weak - recommending this currently for home use/safety - with light verbal cueing needed for safety/education of device. No further acute PT needs identified. PT to sign off. Thanks for the referral.     Follow Up Recommendations No PT follow up    Equipment Recommendations  Rolling walker with 5" wheels    Recommendations for Other Services       Precautions / Restrictions Precautions Precautions: Fall Restrictions Weight Bearing Restrictions: No      Mobility  Bed Mobility Overal bed mobility: Modified Independent             General bed mobility comments: only needed assist with line management, otherwise Mod I  Transfers Overall transfer level: Needs assistance Equipment used: Rolling walker (2 wheeled) Transfers: Sit to/from Stand Sit to Stand: Min guard;Supervision         General transfer comment: for general safety  Ambulation/Gait Ambulation/Gait assistance: Supervision Gait Distance (Feet): 200 Feet Assistive device: Rolling walker (2 wheeled) Gait Pattern/deviations: Step-through pattern;Decreased stride length;Trunk flexed Gait velocity: decreased   General Gait Details: some verbal cueing for general safety with device and obstacle navigation, no LOB  Stairs            Wheelchair Mobility    Modified Rankin (Stroke Patients  Only)       Balance Overall balance assessment: Mild deficits observed, not formally tested                                           Pertinent Vitals/Pain Pain Assessment: No/denies pain    Home Living Family/patient expects to be discharged to:: Private residence Living Arrangements: Alone   Type of Home: Apartment Home Access: Level entry;Elevator     Home Layout: One level Home Equipment: Grab bars - toilet;Grab bars - tub/shower      Prior Function Level of Independence: Independent               Hand Dominance        Extremity/Trunk Assessment        Lower Extremity Assessment Lower Extremity Assessment: Overall WFL for tasks assessed       Communication   Communication: No difficulties  Cognition Arousal/Alertness: Awake/alert Behavior During Therapy: WFL for tasks assessed/performed Overall Cognitive Status: Within Functional Limits for tasks assessed                                        General Comments      Exercises     Assessment/Plan    PT Assessment Patent does not need any further PT services  PT Problem List         PT Treatment Interventions  PT Goals (Current goals can be found in the Care Plan section)  Acute Rehab PT Goals Patient Stated Goal: return home PT Goal Formulation: With patient Time For Goal Achievement: 05/04/18 Potential to Achieve Goals: Good    Frequency     Barriers to discharge        Co-evaluation               AM-PAC PT "6 Clicks" Daily Activity  Outcome Measure Difficulty turning over in bed (including adjusting bedclothes, sheets and blankets)?: A Little Difficulty moving from lying on back to sitting on the side of the bed? : A Little Difficulty sitting down on and standing up from a chair with arms (e.g., wheelchair, bedside commode, etc,.)?: A Little Help needed moving to and from a bed to chair (including a wheelchair)?: A Little Help  needed walking in hospital room?: A Little Help needed climbing 3-5 steps with a railing? : A Little 6 Click Score: 18    End of Session Equipment Utilized During Treatment: Gait belt Activity Tolerance: Patient tolerated treatment well Patient left: in bed;with call bell/phone within reach Nurse Communication: Mobility status PT Visit Diagnosis: Unsteadiness on feet (R26.81);Other abnormalities of gait and mobility (R26.89);Muscle weakness (generalized) (M62.81)    Time: [id number] PT Time Calculation (min) (ACUTE ONLY): 19 min   Charges:   PT Evaluation $PT Eval Moderate Complexity: 1 Mod     PT G Codes:       Kipp LaurenceStephanie R Mat Stuard, PT, DPT 04/27/18 11:52 AM

## 2018-04-27 NOTE — Progress Notes (Signed)
Raymond KansasMatthew Kuhnert to be D/C'd Home per MD order.  Discussed with the patient and all questions fully answered.  VSS, Skin clean, dry and intact without evidence of skin break down, no evidence of skin tears noted. IV catheter discontinued intact. Site without signs and symptoms of complications. Dressing and pressure applied.  An After Visit Summary was printed and given to the patient. Patient received prescription.  D/c education completed with patient/family including follow up instructions, medication list, d/c activities limitations if indicated, with other d/c instructions as indicated by MD - patient able to verbalize understanding, all questions fully answered.   Patient instructed to return to ED, call 911, or call MD for any changes in condition.   Patient escorted via WC, and D/C home via private auto.  Marca AnconaLaura M Avish Torry 04/27/2018 3:28 PM

## 2018-04-29 LAB — CULTURE, BLOOD (ROUTINE X 2)
Culture: NO GROWTH
Culture: NO GROWTH
Special Requests: ADEQUATE

## 2018-09-07 ENCOUNTER — Emergency Department (HOSPITAL_COMMUNITY)
Admission: EM | Admit: 2018-09-07 | Discharge: 2018-09-08 | Disposition: A | Discharge status: Home / Self Care | Payer: Medicaid Other | Attending: Emergency Medicine | Admitting: Emergency Medicine

## 2018-09-07 ENCOUNTER — Other Ambulatory Visit: Payer: Self-pay

## 2018-09-07 ENCOUNTER — Encounter (HOSPITAL_COMMUNITY): Payer: Self-pay | Admitting: Emergency Medicine

## 2018-09-07 DIAGNOSIS — R000 Tachycardia, unspecified: Secondary | ICD-10-CM | POA: Insufficient documentation

## 2018-09-07 DIAGNOSIS — R569 Unspecified convulsions: Secondary | ICD-10-CM

## 2018-09-07 DIAGNOSIS — F1010 Alcohol abuse, uncomplicated: Secondary | ICD-10-CM | POA: Insufficient documentation

## 2018-09-07 LAB — RAPID URINE DRUG SCREEN, HOSP PERFORMED
Amphetamines: NOT DETECTED
Barbiturates: NOT DETECTED
Benzodiazepines: NOT DETECTED
COCAINE: NOT DETECTED
OPIATES: NOT DETECTED
Tetrahydrocannabinol: NOT DETECTED

## 2018-09-07 LAB — CBC
HCT: 41.8 % (ref 39.0–52.0)
HEMOGLOBIN: 13.8 g/dL (ref 13.0–17.0)
MCH: 32.3 pg (ref 26.0–34.0)
MCHC: 33 g/dL (ref 30.0–36.0)
MCV: 97.9 fL (ref 80.0–100.0)
NRBC: 0 % (ref 0.0–0.2)
PLATELETS: 173 10*3/uL (ref 150–400)
RBC: 4.27 MIL/uL (ref 4.22–5.81)
RDW: 13.6 % (ref 11.5–15.5)
WBC: 12.1 10*3/uL — ABNORMAL HIGH (ref 4.0–10.5)

## 2018-09-07 LAB — COMPREHENSIVE METABOLIC PANEL
ALBUMIN: 4.1 g/dL (ref 3.5–5.0)
ALT: 27 U/L (ref 0–44)
ANION GAP: 12 (ref 5–15)
AST: 60 U/L — AB (ref 15–41)
Alkaline Phosphatase: 67 U/L (ref 38–126)
BILIRUBIN TOTAL: 1.3 mg/dL — AB (ref 0.3–1.2)
BUN: 5 mg/dL — AB (ref 8–23)
CHLORIDE: 96 mmol/L — AB (ref 98–111)
CO2: 25 mmol/L (ref 22–32)
Calcium: 9.4 mg/dL (ref 8.9–10.3)
Creatinine, Ser: 0.67 mg/dL (ref 0.61–1.24)
GFR calc Af Amer: >60 mL/min (ref >60)
GFR calc non Af Amer: >60 mL/min (ref >60)
GLUCOSE: 101 mg/dL — AB (ref 70–99)
POTASSIUM: 3.6 mmol/L (ref 3.5–5.1)
Sodium: 133 mmol/L — ABNORMAL LOW (ref 135–145)
TOTAL PROTEIN: 9 g/dL — AB (ref 6.5–8.1)

## 2018-09-07 LAB — I-STAT CG4 LACTIC ACID, ED
LACTIC ACID, VENOUS: 2.47 mmol/L — AB (ref 0.5–1.9)
Lactic Acid, Venous: 1.72 mmol/L (ref 0.5–1.9)

## 2018-09-07 LAB — ETHANOL

## 2018-09-07 LAB — CBG MONITORING, ED: GLUCOSE-CAPILLARY: 129 mg/dL — AB (ref 70–99)

## 2018-09-07 MED ORDER — LORAZEPAM 1 MG PO TABS: 0.0000 mg | ORAL_TABLET | Freq: Two times a day (BID) | ORAL | Status: DC

## 2018-09-07 MED ORDER — LORAZEPAM 2 MG/ML IJ SOLN: 0.0000 mg | Freq: Two times a day (BID) | INTRAMUSCULAR | Status: DC

## 2018-09-07 MED ORDER — LORAZEPAM 2 MG/ML IJ SOLN: 0.0000 mg | Freq: Four times a day (QID) | INTRAMUSCULAR | Status: DC

## 2018-09-07 MED ORDER — VITAMIN B-1 100 MG PO TABS
100.0000 mg | ORAL_TABLET | Freq: Every day | ORAL | Status: DC
  Administered 2018-09-07: 100 mg via ORAL
  Filled 2018-09-07: qty 1

## 2018-09-07 MED ORDER — THIAMINE HCL 100 MG/ML IJ SOLN: 100.0000 mg | Freq: Every day | INTRAMUSCULAR | Status: DC

## 2018-09-07 MED ORDER — LORAZEPAM 1 MG PO TABS
0.0000 mg | ORAL_TABLET | Freq: Four times a day (QID) | ORAL | Status: DC
  Administered 2018-09-07: 1 mg via ORAL
  Filled 2018-09-07: qty 1

## 2018-09-07 NOTE — ED Triage Notes (Signed)
Per EMS pt in apartment with friends stated to friends "am going to have a seizure." Two reported seizures; post itical but oriented at present with little confusion.

## 2018-09-07 NOTE — ED Notes (Signed)
Patient ambulated himself to BR with no assistance; steady gait.

## 2018-09-07 NOTE — ED Notes (Signed)
Family at bedside. 

## 2018-09-07 NOTE — ED Provider Notes (Signed)
[facility]-EMERGENCY DEPT Provider Note   CSN: [id number] Arrival date & time: 09/07/18  1711     History   Chief Complaint Chief Complaint  Patient presents with  . Seizures    HPI Raymond [name] is a 62 y.o. male.  62 year old male with past medical history including chronic alcohol abuse, seizures presents with seizure-like activity.  Patient states that he was at home with friends today when he began feeling strange and told them that he thought that he was about to have a seizure.  They reported to seizure-like episode.  He states that he recalls the entire episode; he remembers them calling EMS and he remembers EMS being at his home.  This happened in the past, roughly 2 years ago, but he states he is not chronically on any seizure medications.  Currently he feels fine and denies any complaints of pain.  No recent illness including no fevers, vomiting, cough, or abdominal pain.  He normally drinks daily, has had less to drink for the past few days, last alcohol was yesterday.  Denies any other drug use.  The history is provided by the patient.  Seizures      Past Medical History:  Diagnosis Date  . Seizures [facility])     Patient Active Problem List   Diagnosis Date Noted  . Refeeding syndrome 04/27/2018  . Sepsis (HCC) 04/24/2018  . Alcohol use with alcohol-induced disorder (HCC) 04/24/2018  . Convulsions (HCC) 04/14/2013  . Rhabdomyolysis 04/14/2013  . Metabolic acidosis 04/14/2013    Past Surgical History:  Procedure Laterality Date  . TONSILLECTOMY          Home Medications    Prior to Admission medications   Medication Sig Start Date End Date Taking? Authorizing Provider  cephALEXin (KEFLEX) 500 MG capsule Take 1 capsule (500 mg total) by mouth every 12 (twelve) hours. Patient not taking: Reported on 09/07/2018 04/27/18   Burna Cash, MD    Family History Family History  Problem Relation Age of Onset  . Other Neg Hx      Social History Social History   Tobacco Use  . Smoking status: Never Smoker  . Smokeless tobacco: Never Used  Substance Use Topics  . Alcohol use: Yes    Comment: pt reports drinking a pint a day  . Drug use: No     Allergies   Patient has no known allergies.   Review of Systems Review of Systems  Neurological: Positive for seizures.   All other systems reviewed and are negative except that which was mentioned in HPI   Physical Exam Updated Vital Signs BP (!) 134/92 (BP Location: Right Arm)   Pulse 91   Temp 98.6 F (37 C) (Oral)   Resp 16   SpO2 100%   Physical Exam  Constitutional: He is oriented to person, place, and time. He appears well-developed and well-nourished. No distress.  Thin, frail, disheveled, awake and alert  HENT:  Head: Normocephalic and atraumatic.  Moist mucous membranes  Eyes: Pupils are equal, round, and reactive to light. EOM are normal.  B/l conjunctival injection  Neck: Neck supple.  Cardiovascular: Regular rhythm and normal heart sounds. Tachycardia present.  No murmur heard. Pulmonary/Chest: Effort normal and breath sounds normal.  Abdominal: Soft. Bowel sounds are normal. He exhibits no distension. There is no tenderness.  Musculoskeletal: He exhibits no edema.  Neurological: He is alert and oriented to person, place, and time.  Fluent speech, disoriented to time but otherwise answering questions  appropriately, normal finger-to-nose testing, active pronator drift, 5/5 strength throughout; mild resting tremor; unsteady gait but able to ambulate independently  Skin: Skin is warm and dry.  Psychiatric:  Calm, cooperative  Nursing note and vitals reviewed.    ED Treatments / Results  Labs (all labs ordered are listed, but only abnormal results are displayed) Labs Reviewed  COMPREHENSIVE METABOLIC PANEL - Abnormal; Notable for the following components:      Result Value   Sodium 133 (*)    Chloride 96 (*)    Glucose, Bld  101 (*)    BUN 5 (*)    Total Protein 9.0 (*)    AST 60 (*)    Total Bilirubin 1.3 (*)    All other components within normal limits  CBC - Abnormal; Notable for the following components:   WBC 12.1 (*)    All other components within normal limits  CBG MONITORING, ED - Abnormal; Notable for the following components:   Glucose-Capillary 129 (*)    All other components within normal limits  I-STAT CG4 LACTIC ACID, ED - Abnormal; Notable for the following components:   Lactic Acid, Venous 2.47 (*)    All other components within normal limits  ETHANOL  RAPID URINE DRUG SCREEN, HOSP PERFORMED  I-STAT CG4 LACTIC ACID, ED    EKG EKG Interpretation  Date/Time:  Monday September 07 2018 20:18:28 EDT Ventricular Rate:  96 PR Interval:    QRS Duration: 95 QT Interval:  386 QTC Calculation: 488 R Axis:   -16 Text Interpretation:  Sinus rhythm Probable left atrial enlargement Borderline left axis deviation Low voltage, precordial leads Minimal ST elevation, inferior leads Borderline prolonged QT interval similar to previous Confirmed by Frederick Peers [phone]) on 09/07/2018 8:46:11 PM   Radiology No results found.  Procedures Procedures (including critical care time)  Medications Ordered in ED Medications  LORazepam (ATIVAN) injection 0-4 mg ( Intravenous See Alternative 09/07/18 2007)    Or  LORazepam (ATIVAN) tablet 0-4 mg (1 mg Oral Given 09/07/18 2007)  LORazepam (ATIVAN) injection 0-4 mg (has no administration in time range)    Or  LORazepam (ATIVAN) tablet 0-4 mg (has no administration in time range)  thiamine (VITAMIN B-1) tablet 100 mg (100 mg Oral Given 09/07/18 2007)    Or  thiamine (B-1) injection 100 mg ( Intravenous See Alternative 09/07/18 2007)     Initial Impression / Assessment and Plan / ED Course  I have reviewed the triage vital signs and the nursing notes.  Pertinent labs & imaging results that were available during my care of the patient were reviewed by  me and considered in my medical decision making (see chart for details).    Mild tachycardia initially, patient was alert and able to answer questions.  Mild tremor.  Blood glucose reassuring.  Concern for possible alcohol withdrawal seizure, placed on CIWA protocol and obtained labs.   Labs overall reassuring, mild lactic acidosis that corrected with fluids.  He only required 1 dose of Ativan initially and was observed for 6 hours during which time he did not require any repeat dosing and had no seizure-like activity.  I am not convinced that he actually had a withdrawal seizure given that he recalls the entire episode and it does not sound like he ever lost consciousness.  Discussed options with him and he states that he is not ready to quit drinking, therefore I do not feel that hospitalization would be of much benefit for him.  He wants  to go home.  I have extensively reviewed return precautions with him.  Ambulatory and drinking OJ at time of discharge. Final Clinical Impressions(s) / ED Diagnoses   Final diagnoses:  Alcohol abuse  Seizure-like activity [facility])    ED Discharge Orders    None       Okie Jansson, Ambrose Finland, MD 09/08/18 [phone]

## 2018-09-07 NOTE — Discharge Instructions (Signed)
Return to ER if any further episodes concerning for

## 2018-12-22 IMAGING — DX DG CHEST 2V
2 series · 2 of 2 positions shown · non-contrast
Comparison: July 15, 2017

CLINICAL DATA: Fever and weakness

EXAM:
CHEST - 2 VIEW

[x chest ap]
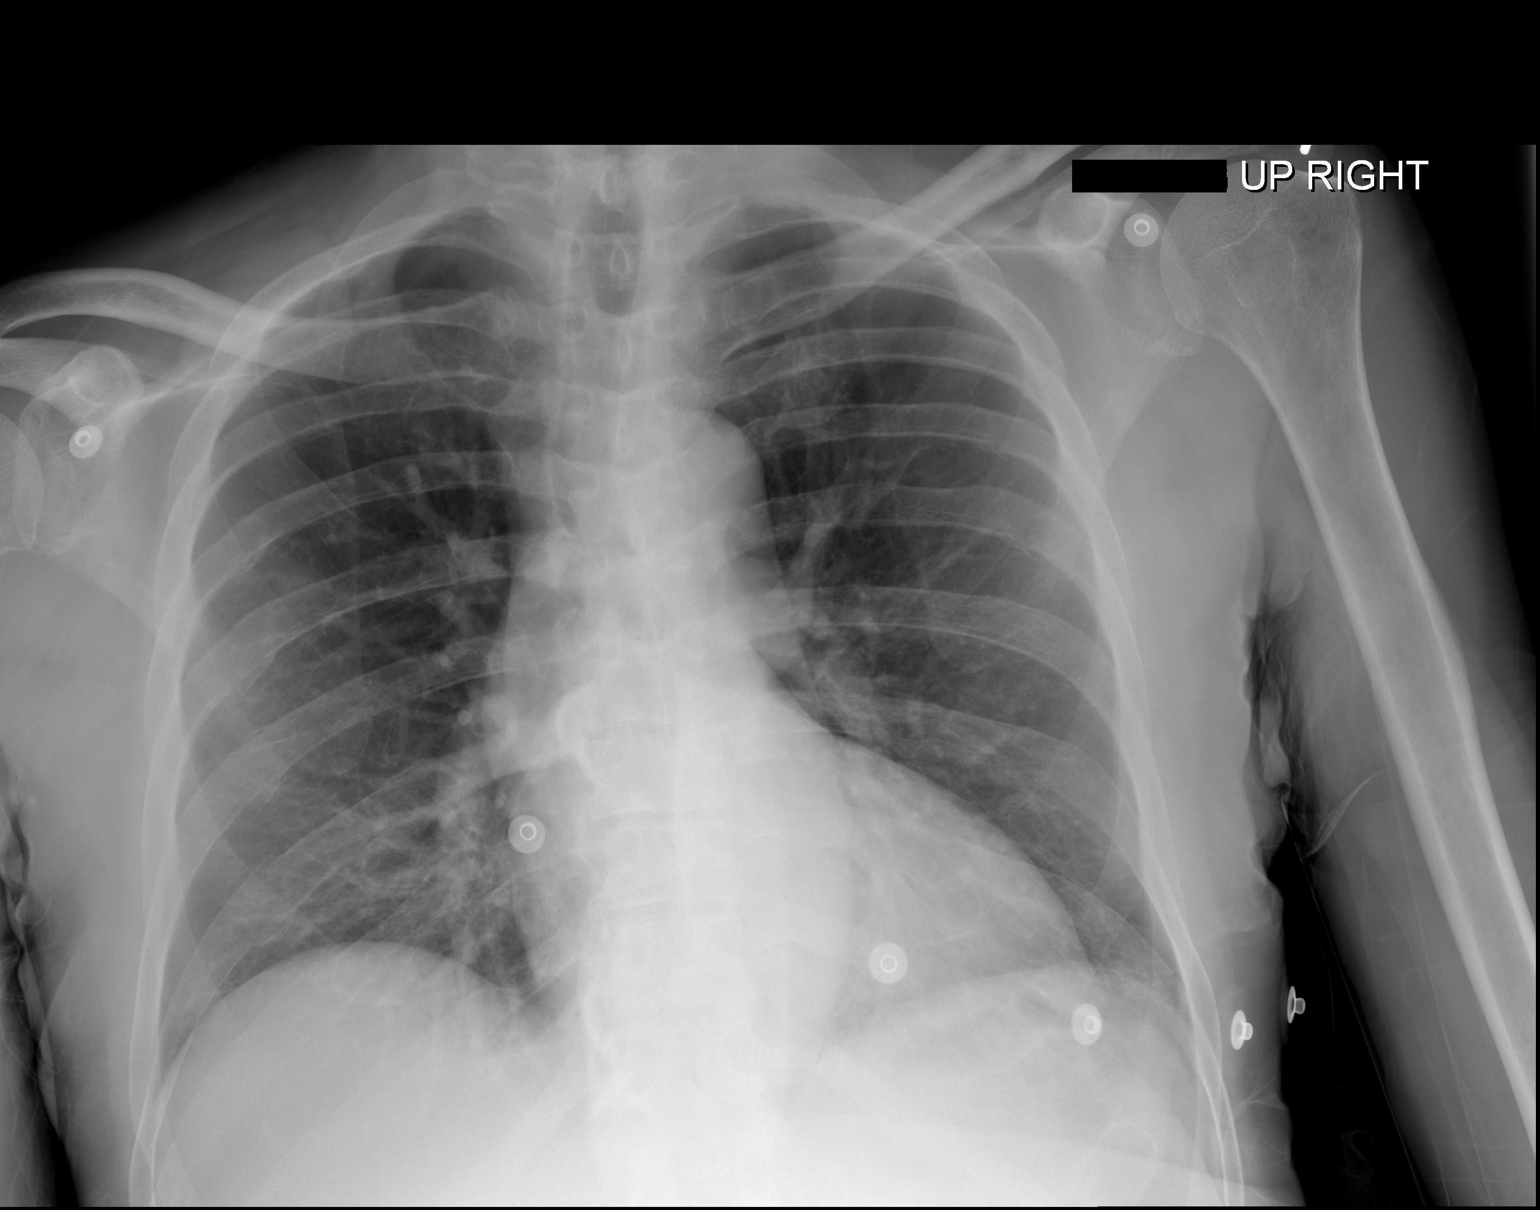

[w chest lat]
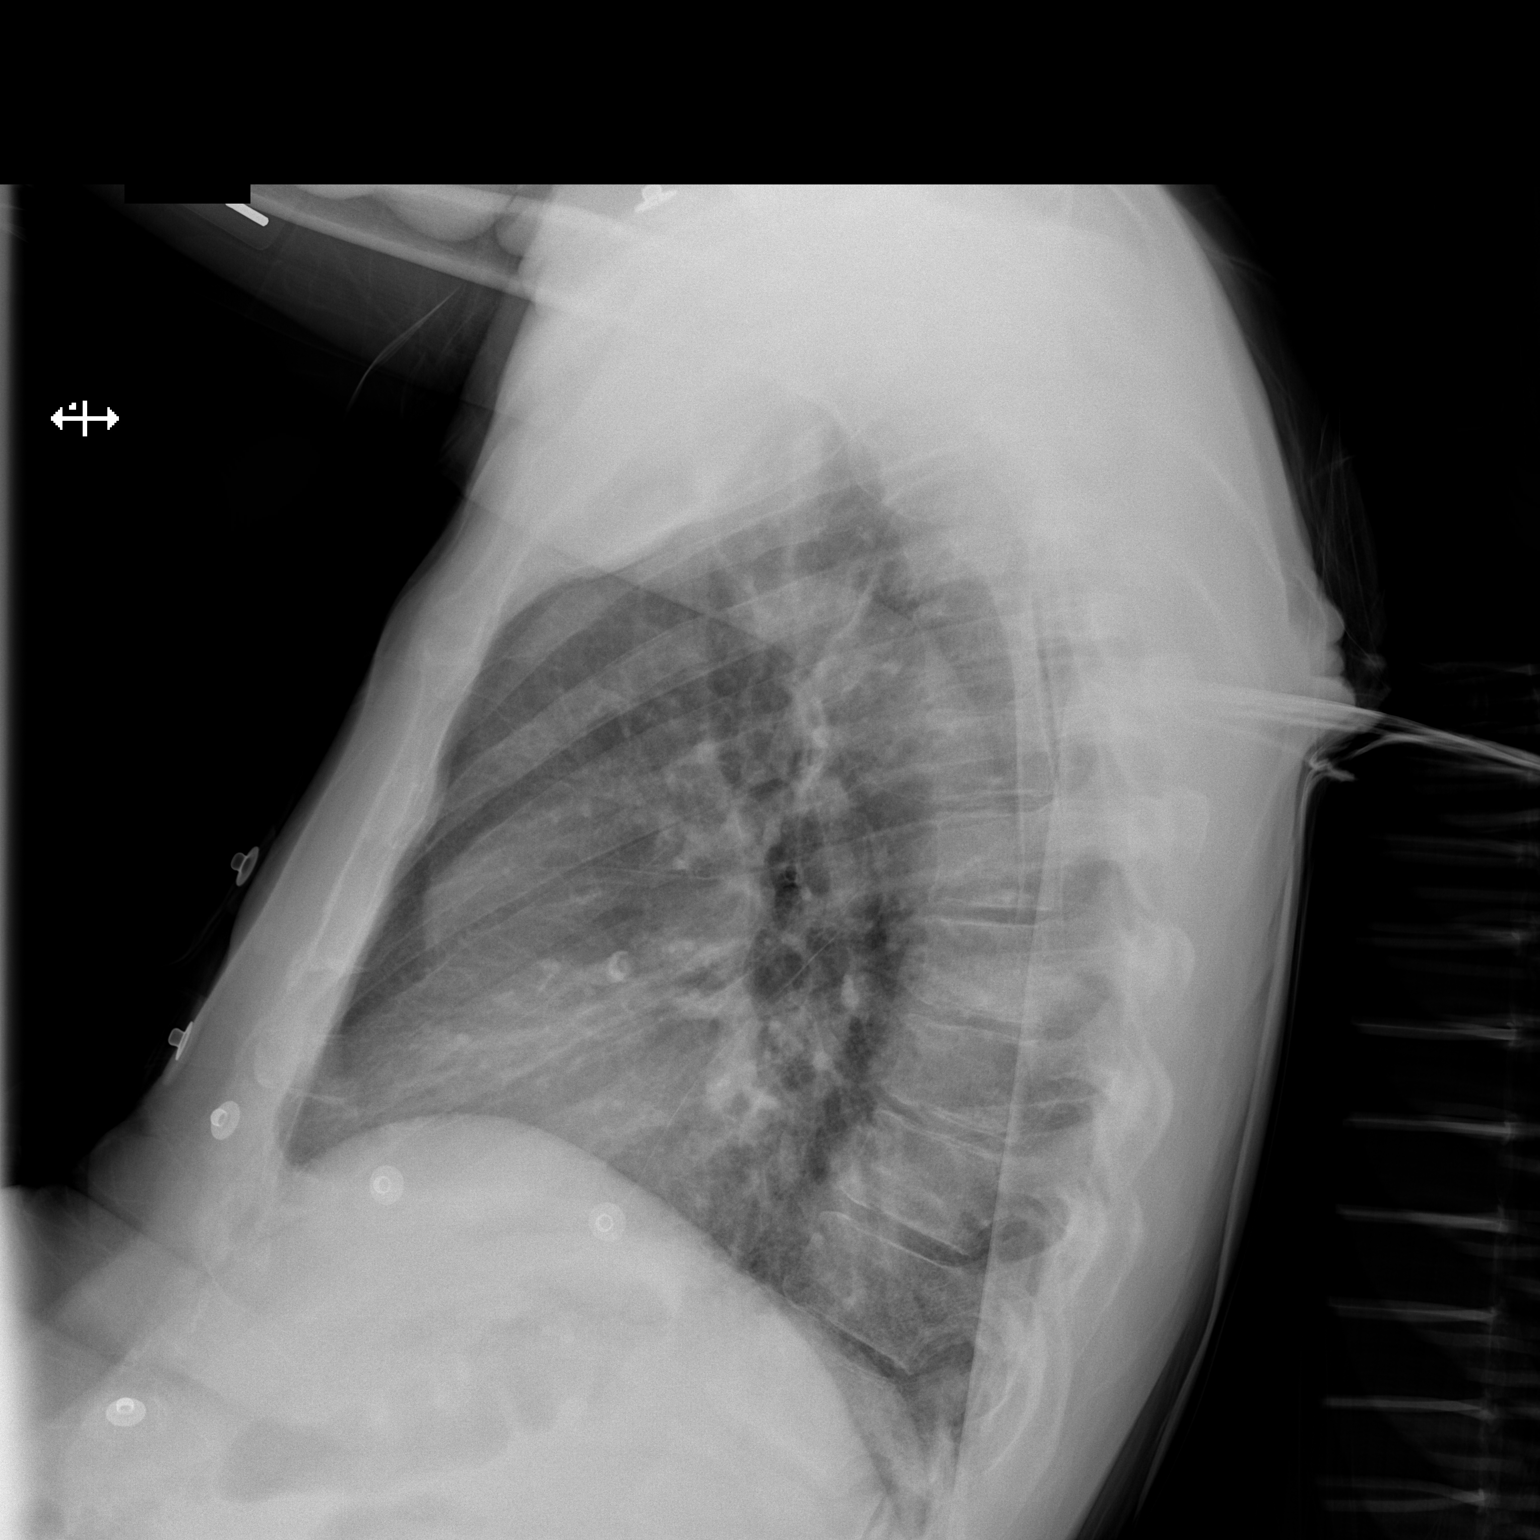

[2 of 2 positions shown; findings below may reference images not displayed]

FINDINGS: There is no edema or consolidation. Heart size and pulmonary
vascularity are normal. No adenopathy. There are several prior
healed rib fractures on the right. There are foci of coronary artery
calcification.
IMPRESSION: No edema or consolidation. Foci of coronary artery calcification
noted. Several healed rib fractures on the right.

## 2019-03-12 DEATH — deceased

## 2019-04-12 DEATH — deceased
